# Patient Record
Sex: Female | Born: 1970 | Race: White | Hispanic: No | Marital: Married | Smoking: Never smoker
Health system: Southern US, Community
[De-identification: ages and names within clinical notes are randomized; demographics above are authoritative.]

## PROBLEM LIST (undated history)

## (undated) DIAGNOSIS — T7840XA Allergy, unspecified, initial encounter: Secondary | ICD-10-CM

## (undated) DIAGNOSIS — M1990 Unspecified osteoarthritis, unspecified site: Secondary | ICD-10-CM

## (undated) DIAGNOSIS — K219 Gastro-esophageal reflux disease without esophagitis: Secondary | ICD-10-CM

## (undated) HISTORY — PX: BREAST BIOPSY: SHX20

## (undated) HISTORY — DX: Gastro-esophageal reflux disease without esophagitis: K21.9

## (undated) HISTORY — PX: BREAST EXCISIONAL BIOPSY: SUR124

## (undated) HISTORY — DX: Allergy, unspecified, initial encounter: T78.40XA

## (undated) HISTORY — DX: Unspecified osteoarthritis, unspecified site: M19.90

---

## 2011-10-20 HISTORY — PX: COLONOSCOPY: SHX174

## 2016-01-31 HISTORY — PX: ESOPHAGOGASTRODUODENOSCOPY: SHX1529

## 2021-08-26 ENCOUNTER — Ambulatory Visit (INDEPENDENT_AMBULATORY_CARE_PROVIDER_SITE_OTHER): Payer: No Typology Code available for payment source | Admitting: Family

## 2021-08-26 ENCOUNTER — Other Ambulatory Visit: Payer: Self-pay

## 2021-08-26 ENCOUNTER — Encounter: Payer: Self-pay | Admitting: Family

## 2021-08-26 VITALS — BP 98/58 | HR 75 | Temp 98.1°F | Ht 63.0 in | Wt 148.2 lb

## 2021-08-26 DIAGNOSIS — Z1211 Encounter for screening for malignant neoplasm of colon: Secondary | ICD-10-CM

## 2021-08-26 DIAGNOSIS — M545 Low back pain, unspecified: Secondary | ICD-10-CM | POA: Diagnosis not present

## 2021-08-26 DIAGNOSIS — R350 Frequency of micturition: Secondary | ICD-10-CM | POA: Diagnosis not present

## 2021-08-26 DIAGNOSIS — Z1231 Encounter for screening mammogram for malignant neoplasm of breast: Secondary | ICD-10-CM | POA: Diagnosis not present

## 2021-08-26 DIAGNOSIS — G8929 Other chronic pain: Secondary | ICD-10-CM

## 2021-08-26 LAB — POCT URINALYSIS DIP (MANUAL ENTRY)
Bilirubin, UA: NEGATIVE
Blood, UA: NEGATIVE
Glucose, UA: NEGATIVE mg/dL
Ketones, POC UA: NEGATIVE mg/dL
Nitrite, UA: NEGATIVE
Protein Ur, POC: NEGATIVE mg/dL
Spec Grav, UA: 1.015 (ref 1.010–1.025)
Urobilinogen, UA: 0.2 E.U./dL
pH, UA: 5 (ref 5.0–8.0)

## 2021-08-26 MED ORDER — CYCLOBENZAPRINE HCL 10 MG PO TABS
10.0000 mg | ORAL_TABLET | Freq: Three times a day (TID) | ORAL | 0 refills | Status: DC | PRN
Start: 2021-08-26 — End: 2023-03-21

## 2021-08-26 NOTE — Progress Notes (Signed)
Lindsey Fields is a 50 y.o. female with the following history as recorded in EpicCare:  There are no problems to display for this patient.   Current Outpatient Medications  Medication Sig Dispense Refill   cyclobenzaprine (FLEXERIL) 10 MG tablet Take 1 tablet (10 mg total) by mouth 3 (three) times daily as needed for muscle spasms. 30 tablet 0   fexofenadine (ALLEGRA) 180 MG tablet Take 180 mg by mouth daily.     naproxen sodium (ALEVE) 220 MG tablet Take 220 mg by mouth.     Probiotic Product (PROBIOTIC-10 PO) Take by mouth.     VITAMIN D, CHOLECALCIFEROL, PO Take by mouth.     No current facility-administered medications for this visit.    Allergies: Metaxalone  No past medical history on file.  The histories are not reviewed yet. Please review them in the "History" navigator section and refresh this SmartLink.  No family history on file.  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  Head: Normocephalic and atraumatic  Eyes: Sclera and conjunctiva clear; pupils round and reactive to light; extraocular movements intact  Ears: External normal; canals  clear; tympanic membranes normal  Oropharynx: Pink, supple. No suspicious lesions  Neck: Supple without thyromegaly, adenopathy  Lungs: Respirations unlabored; clear to auscultation bilaterally without wheeze, rales, rhonchi  CVS exam: normal rate and regular rhythm.  Musculoskeletal: No deformities; no active joint inflammation  Extremities: No edema, cyanosis, clubbing  Vessels: Symmetric bilaterally  Neurologic: Alert and oriented; speech intact; face symmetrical; moves all extremities well; CNII-XII intact without focal deficit   Assessment:  1. Chronic left-sided low back pain, unspecified whether sciatica present   2. Visit for screening mammogram   3. Colon cancer screening   4. Urinary frequency     Plan:  Discussed that she will most likely need MRI with possible epidural steroid injection and PT; will refer to sports medicine to discuss management; Order updated; Order for Cologuard; patient is sure that her insurance will cover; Check U/A and urine culture; Kegel exercises discussed; to consider pelvic floor PT as well;   CPE due summer 2023- she will get previous records;   This visit occurred during the SARS-CoV-2 public health emergency.  Safety protocols were in place, including screening questions prior to the visit, additional usage of staff PPE, and extensive cleaning of exam room while observing appropriate contact time as indicated for disinfecting solutions.    No follow-ups on file.  Orders Placed This Encounter  Procedures  Urine Culture   MM Digital Screening    Standing Status:   Future    Standing Expiration Date:   08/26/2022    Order Specific Question:   Reason for Exam (SYMPTOM  OR DIAGNOSIS REQUIRED)    Answer:   screening mammogram    Order Specific Question:   Is the patient pregnant?    Answer:   No    Order Specific Question:   Preferred imaging location?    Answer:   [facility]   Cologuard   Ambulatory referral to Sports Medicine     Referral Priority:   Routine    Referral Type:   Consultation    Number of Visits Requested:   1   POCT urinalysis dipstick    Requested Prescriptions   Signed Prescriptions Disp Refills   cyclobenzaprine (FLEXERIL) 10 MG tablet 30 tablet 0    Sig: Take 1 tablet (10 mg total) by mouth 3 (three) times daily as needed for muscle spasms.

## 2021-08-26 NOTE — Patient Instructions (Addendum)
  Very nice to meet you today; please get your records from your GYN as we discussed;    Kegel Exercises Kegel exercises can help strengthen your pelvic floor muscles. The pelvic floor is a group of muscles that support your rectum, small intestine, and bladder. In females, pelvic floor muscles also help support the womb (uterus). These muscles help you control the flow of urine and stool. Kegel exercises are painless and simple, and they do not require any equipment. Your provider may suggest Kegel exercises to: Improve bladder and bowel control. Improve sexual response. Improve weak pelvic floor muscles after surgery to remove the uterus (hysterectomy) or pregnancy (females). Improve weak pelvic floor muscles after prostate gland removal or surgery (males). Kegel exercises involve squeezing your pelvic floor muscles, which are the same muscles you squeeze when you try to stop the flow of urine or keep from passing gas. The exercises can be done while sitting, standing, or lying down, but it is best to vary your position. Exercises How to do Kegel exercises: Squeeze your pelvic floor muscles tight. You should feel a tight lift in your rectal area. If you are a female, you should also feel a tightness in your vaginal area. Keep your stomach, buttocks, and legs relaxed. Hold the muscles tight for up to 10 seconds. Breathe normally. Relax your muscles. Repeat as told by your health care provider. Repeat this exercise daily as told by your health care provider. Continue to do this exercise for at least 4-6 weeks, or for as long as told by your health care provider. You may be referred to a physical therapist who can help you learn more about how to do Kegel exercises. Depending on your condition, your health care provider may recommend: Varying how long you squeeze your muscles. Doing several sets of exercises every day. Doing exercises for several weeks. Making Kegel exercises a part of your  regular exercise routine. This information is not intended to replace advice given to you by your health care provider. Make sure you discuss any questions you have with your health care provider. Document Revised: 10/13/2020 Document Reviewed: 06/12/2018 Elsevier Patient Education  2022 [employer].

## 2021-08-27 LAB — URINE CULTURE
MICRO NUMBER:: 12535051
Result:: NO GROWTH
SPECIMEN QUALITY:: ADEQUATE

## 2021-09-08 ENCOUNTER — Telehealth (HOSPITAL_BASED_OUTPATIENT_CLINIC_OR_DEPARTMENT_OTHER): Payer: Self-pay

## 2021-09-21 ENCOUNTER — Ambulatory Visit (INDEPENDENT_AMBULATORY_CARE_PROVIDER_SITE_OTHER): Payer: No Typology Code available for payment source | Admitting: Family Medicine

## 2021-09-21 ENCOUNTER — Encounter: Payer: Self-pay | Admitting: Family Medicine

## 2021-09-21 VITALS — Ht 63.0 in | Wt 148.0 lb

## 2021-09-21 DIAGNOSIS — M357 Hypermobility syndrome: Secondary | ICD-10-CM | POA: Diagnosis not present

## 2021-09-21 DIAGNOSIS — M533 Sacrococcygeal disorders, not elsewhere classified: Secondary | ICD-10-CM | POA: Insufficient documentation

## 2021-09-21 MED ORDER — DICLOFENAC SODIUM 2 % EX SOLN: 1.0000 "application " | Freq: Two times a day (BID) | CUTANEOUS | 2 refills | Status: DC

## 2021-09-21 NOTE — Progress Notes (Signed)
Tried Noha Milberger - 50 y.o. female MRN [id number]  Date of birth: 03-29-1971  SUBJECTIVE:  Including CC & ROS.  No chief complaint on file.   Jearline Hirschhorn is a 50 y.o. female that is presenting with left-sided gluteal pain.  Pain is intermittent in nature.  Has tried physical therapy in the past.  No injury inciting event.   Review of Systems See HPI   HISTORY: Past Medical, Surgical, Social, and Family History Reviewed & Updated per EMR.   Pertinent Historical Findings include:  History reviewed. No pertinent past medical history.  History reviewed. No pertinent surgical history.  History reviewed. No pertinent family history.  Social History   Socioeconomic History   Marital status: Married    Spouse name: Not on file   Number of children: Not on file   Years of education: Not on file   Highest education level: Not on file  Occupational History   Not on file  Tobacco Use   Smoking status: Never   Smokeless tobacco: Never  Substance and Sexual Activity   Alcohol use: Not on file   Drug use: Not on file   Sexual activity: Not on file  Other Topics Concern   Not on file  Social History Narrative   Not on file   Social Determinants of Health   Financial Resource Strain: Not on file  Food Insecurity: Not on file  Transportation Needs: Not on file  Physical Activity: Not on file  Stress: Not on file  Social Connections: Not on file  Intimate Partner Violence: Not on file     PHYSICAL EXAM:  VS: Ht 5\' 3"  (1.6 m)   Wt 148 lb (67.1 kg)   LMP  (LMP Unknown)   BMI 26.22 kg/m  Physical Exam Gen: NAD, alert, cooperative with exam, well-appearing     ASSESSMENT & PLAN:   Hypermobility syndrome Has a component of hypermobility is likely related to the chronicity of her symptoms with normal imaging that has been done previously. -Could consider body braid.  Sacroiliac joint dysfunction of left side Having more pain over the SI joint or the referred pain to  the gluteus. -Counseled on home exercise therapy and supportive care. -Pennsaid. -Could consider physical therapy, imaging or injection.

## 2021-09-21 NOTE — Patient Instructions (Signed)
Nice to meet you  Please try heat over the area  You can try the rub on medicine  Please consider the compression  Please try the exercises  Please send me a message in MyChart with any questions or updates.  Please see me back in 4 weeks.   --Dr. Jordan Likes

## 2021-09-21 NOTE — Assessment & Plan Note (Signed)
Having more pain over the SI joint or the referred pain to the gluteus. -Counseled on home exercise therapy and supportive care. -Pennsaid. -Could consider physical therapy, imaging or injection.

## 2021-09-21 NOTE — Assessment & Plan Note (Signed)
Has a component of hypermobility is likely related to the chronicity of her symptoms with normal imaging that has been done previously. -Could consider body braid.

## 2021-10-25 ENCOUNTER — Encounter (HOSPITAL_BASED_OUTPATIENT_CLINIC_OR_DEPARTMENT_OTHER): Payer: Self-pay

## 2021-10-25 ENCOUNTER — Other Ambulatory Visit: Payer: Self-pay

## 2021-10-25 ENCOUNTER — Ambulatory Visit (HOSPITAL_BASED_OUTPATIENT_CLINIC_OR_DEPARTMENT_OTHER)
Admission: RE | Admit: 2021-10-25 | Discharge: 2021-10-25 | Disposition: A | Discharge status: Home / Self Care | Payer: No Typology Code available for payment source | Source: Ambulatory Visit | Attending: Family | Admitting: Family

## 2021-10-25 DIAGNOSIS — Z1231 Encounter for screening mammogram for malignant neoplasm of breast: Secondary | ICD-10-CM

## 2021-10-27 ENCOUNTER — Encounter: Payer: Self-pay | Admitting: Family Medicine

## 2021-10-27 ENCOUNTER — Ambulatory Visit (INDEPENDENT_AMBULATORY_CARE_PROVIDER_SITE_OTHER): Payer: No Typology Code available for payment source | Admitting: Family Medicine

## 2021-10-27 DIAGNOSIS — M533 Sacrococcygeal disorders, not elsewhere classified: Secondary | ICD-10-CM | POA: Diagnosis not present

## 2021-10-27 NOTE — Assessment & Plan Note (Signed)
Pain is still ongoing.  Has gotten improvement with the compression. -Counseled on home exercise therapy and supportive care. -could consider physical therapy or injection.

## 2021-10-27 NOTE — Progress Notes (Signed)
°  Lindsey Fields - 50 y.o. female MRN [id number]  Date of birth: 19-Sep-1971  SUBJECTIVE:  Including CC & ROS.  No chief complaint on file.   Lindsey Fields is a 50 y.o. female that is following up for her left gluteal pain.  She has gotten a compression of the does add a benefit to wearing it.  Pain is still occurs and has trouble putting on her shoes.   Review of Systems See HPI   HISTORY: Past Medical, Surgical, Social, and Family History Reviewed & Updated per EMR.   Pertinent Historical Findings include:  History reviewed. No pertinent past medical history.  Past Surgical History:  Procedure Laterality Date   BREAST BIOPSY Left    BREAST EXCISIONAL BIOPSY Left     History reviewed. No pertinent family history.  Social History   Socioeconomic History   Marital status: Married    Spouse name: Not on file   Number of children: Not on file   Years of education: Not on file   Highest education level: Not on file  Occupational History   Not on file  Tobacco Use   Smoking status: Never   Smokeless tobacco: Never  Substance and Sexual Activity   Alcohol use: Not on file   Drug use: Not on file   Sexual activity: Not on file  Other Topics Concern   Not on file  Social History Narrative   Not on file   Social Determinants of Health   Financial Resource Strain: Not on file  Food Insecurity: Not on file  Transportation Needs: Not on file  Physical Activity: Not on file  Stress: Not on file  Social Connections: Not on file  Intimate Partner Violence: Not on file     PHYSICAL EXAM:  VS: BP 108/70 (BP Location: Left Arm, Patient Position: Sitting)    Ht 5\' 3"  (1.6 m)    Wt 148 lb (67.1 kg)    BMI 26.22 kg/m  Physical Exam Gen: NAD, alert, cooperative with exam, well-appearing   ASSESSMENT & PLAN:   Sacroiliac joint dysfunction of left side Pain is still ongoing.  Has gotten improvement with the compression. -Counseled on home exercise therapy and supportive  care. -could consider physical therapy or injection.

## 2022-01-10 ENCOUNTER — Encounter: Payer: Self-pay | Admitting: Family

## 2022-01-10 ENCOUNTER — Ambulatory Visit (INDEPENDENT_AMBULATORY_CARE_PROVIDER_SITE_OTHER): Payer: No Typology Code available for payment source | Admitting: Family

## 2022-01-10 VITALS — BP 110/70 | HR 65 | Temp 97.8°F | Ht 63.0 in | Wt 145.8 lb

## 2022-01-10 DIAGNOSIS — R1012 Left upper quadrant pain: Secondary | ICD-10-CM

## 2022-01-10 DIAGNOSIS — R109 Unspecified abdominal pain: Secondary | ICD-10-CM | POA: Diagnosis not present

## 2022-01-10 NOTE — Progress Notes (Signed)
?Lindsey Fields is a 51 y.o. female with the following history as recorded in EpicCare:  ?Patient Active Problem List  ? Diagnosis Date Noted  ? Sacroiliac joint dysfunction of left side 09/21/2021  ? Hypermobility syndrome 09/21/2021  ?  ?Current Outpatient Medications  ?Medication Sig Dispense Refill  ? cyclobenzaprine (FLEXERIL) 10 MG tablet Take 1 tablet (10 mg total) by mouth 3 (three) times daily as needed for muscle spasms. 30 tablet 0  ? fexofenadine (ALLEGRA) 180 MG tablet Take 180 mg by mouth daily.    ? naproxen sodium (ALEVE) 220 MG tablet Take 220 mg by mouth.    ? Probiotic Product (PROBIOTIC-10 PO) Take by mouth.    ? VITAMIN D, CHOLECALCIFEROL, PO Take by mouth.    ? ?No current facility-administered medications for this visit.  ?  ?Allergies: Metaxalone  ?No past medical history on file.  ?Past Surgical History:  ?Procedure Laterality Date  ? BREAST BIOPSY Left   ? BREAST EXCISIONAL BIOPSY Left   ?  ?No family history on file.  ?Social History  ? ?Tobacco Use  ? Smoking status: Never  ? Smokeless tobacco: Never  ?Substance Use Topics  ? Alcohol use: Not on file  ?  ?Subjective:  ? ?Abdominal pain/ bloating x 3-4 weeks; limited response to OTC medication; ?Feels like pain has worsened in the past 2-3 days; pain is localized in the LUQ;  ?Normal bowel movements; no nausea or vomiting;  ?FH of gallbladder disease/ chronic pancreatitis;  ? ? ? ?Objective:  ?Vitals:  ? 01/10/22 1552  ?BP: 110/70  ?Pulse: 65  ?Temp: 97.8 ?F (36.6 ?C)  ?SpO2: 96%  ?Weight: 145 lb 12.8 oz (66.1 kg)  ?Height: '5\' 3"'  (1.6 m)  ?  ?General: Well developed, well nourished, in no acute distress  ?Skin : Warm and dry.  ?Head: Normocephalic and atraumatic  ?Eyes: Sclera and conjunctiva clear; pupils round and reactive to light; extraocular movements intact  ?Ears: External normal; canals clear; tympanic membranes normal  ?Oropharynx: Pink, supple. No suspicious lesions  ?Neck: Supple without thyromegaly, adenopathy  ?Lungs:  Respirations unlabored; clear to auscultation bilaterally without wheeze, rales, rhonchi  ?CVS exam: normal rate and regular rhythm.  ?Abdomen: Soft; nontender; nondistended; normoactive bowel sounds; no masses or hepatosplenomegaly  ?Neurologic: Alert and oriented; speech intact; face symmetrical; moves all extremities well; CNII-XII intact without focal deficit  ? ?Assessment:  ?1. Abdominal pain, unspecified abdominal location   ?2. LUQ pain   ?  ?Plan:  ?? Gallbladder disease; update labs today and update abdominal imaging; follow up to be determined; discussed medication but agree that most likely not beneficial since no relief with OTC medications;  ? ?This visit occurred during the SARS-CoV-2 public health emergency.  Safety protocols were in place, including screening questions prior to the visit, additional usage of staff PPE, and extensive cleaning of exam room while observing appropriate contact time as indicated for disinfecting solutions.  ? ? ?No follow-ups on file.  ?Orders Placed This Encounter  ?Procedures  ? US Abdomen Complete  ?  Standing Status:   Future  ?  Standing Expiration Date:   01/11/2023  ?  Order Specific Question:   Reason for Exam (SYMPTOM  OR DIAGNOSIS REQUIRED)  ?  Answer:   abdominal pain  ?  Order Specific Question:   Preferred imaging location?  ?  Answer:   [facility]  ? CBC with Differential/Platelet  ? Comp Met (CMET)  ? Amylase  ? Lipase  ?  ?  Requested Prescriptions  ? ? No prescriptions requested or ordered in this encounter  ?  ? ?

## 2022-01-10 NOTE — Progress Notes (Signed)
dig 

## 2022-01-11 LAB — CBC WITH DIFFERENTIAL/PLATELET
Basophils Absolute: 0.1 10*3/uL (ref 0.0–0.1)
Basophils Relative: 1.2 % (ref 0.0–3.0)
Eosinophils Absolute: 0.1 10*3/uL (ref 0.0–0.7)
Eosinophils Relative: 1.2 % (ref 0.0–5.0)
HCT: 38.5 % (ref 36.0–46.0)
Hemoglobin: 13 g/dL (ref 12.0–15.0)
Lymphocytes Relative: 28.9 % (ref 12.0–46.0)
Lymphs Abs: 1.9 10*3/uL (ref 0.7–4.0)
MCHC: 33.7 g/dL (ref 30.0–36.0)
MCV: 92.9 fl (ref 78.0–100.0)
Monocytes Absolute: 0.4 10*3/uL (ref 0.1–1.0)
Monocytes Relative: 5.3 % (ref 3.0–12.0)
Neutro Abs: 4.2 10*3/uL (ref 1.4–7.7)
Neutrophils Relative %: 63.4 % (ref 43.0–77.0)
Platelets: 289 10*3/uL (ref 150.0–400.0)
RBC: 4.14 Mil/uL (ref 3.87–5.11)
RDW: 13 % (ref 11.5–15.5)
WBC: 6.7 10*3/uL (ref 4.0–10.5)

## 2022-01-11 LAB — COMPREHENSIVE METABOLIC PANEL
ALT: 24 U/L (ref 0–35)
AST: 26 U/L (ref 0–37)
Albumin: 4.3 g/dL (ref 3.5–5.2)
Alkaline Phosphatase: 85 U/L (ref 39–117)
BUN: 13 mg/dL (ref 6–23)
CO2: 27 mEq/L (ref 19–32)
Calcium: 9 mg/dL (ref 8.4–10.5)
Chloride: 106 mEq/L (ref 96–112)
Creatinine, Ser: 0.77 mg/dL (ref 0.40–1.20)
GFR: 89.53 mL/min (ref >60.00)
Glucose, Bld: 82 mg/dL (ref 70–99)
Potassium: 4.2 mEq/L (ref 3.5–5.1)
Sodium: 141 mEq/L (ref 135–145)
Total Bilirubin: 0.4 mg/dL (ref 0.2–1.2)
Total Protein: 6.8 g/dL (ref 6.0–8.3)

## 2022-01-11 LAB — LIPASE: Lipase: 15 U/L (ref 11.0–59.0)

## 2022-01-11 LAB — AMYLASE: Amylase: 79 U/L (ref 27–131)

## 2022-01-16 ENCOUNTER — Ambulatory Visit (HOSPITAL_BASED_OUTPATIENT_CLINIC_OR_DEPARTMENT_OTHER)
Admission: RE | Admit: 2022-01-16 | Discharge: 2022-01-16 | Disposition: A | Discharge status: Home / Self Care | Payer: No Typology Code available for payment source | Source: Ambulatory Visit | Attending: Family | Admitting: Family

## 2022-01-16 ENCOUNTER — Other Ambulatory Visit: Payer: Self-pay

## 2022-01-16 ENCOUNTER — Other Ambulatory Visit: Payer: Self-pay | Admitting: Family

## 2022-01-16 DIAGNOSIS — R109 Unspecified abdominal pain: Secondary | ICD-10-CM

## 2022-01-25 ENCOUNTER — Ambulatory Visit (INDEPENDENT_AMBULATORY_CARE_PROVIDER_SITE_OTHER): Payer: No Typology Code available for payment source | Admitting: Gastroenterology

## 2022-01-25 ENCOUNTER — Encounter: Payer: Self-pay | Admitting: Gastroenterology

## 2022-01-25 ENCOUNTER — Other Ambulatory Visit: Payer: Self-pay

## 2022-01-25 VITALS — BP 102/68 | HR 74 | Ht 62.0 in | Wt 149.0 lb

## 2022-01-25 DIAGNOSIS — K219 Gastro-esophageal reflux disease without esophagitis: Secondary | ICD-10-CM

## 2022-01-25 DIAGNOSIS — K30 Functional dyspepsia: Secondary | ICD-10-CM | POA: Diagnosis not present

## 2022-01-25 DIAGNOSIS — Z1211 Encounter for screening for malignant neoplasm of colon: Secondary | ICD-10-CM | POA: Diagnosis not present

## 2022-01-25 DIAGNOSIS — R142 Eructation: Secondary | ICD-10-CM

## 2022-01-25 NOTE — Progress Notes (Signed)
? ? ?          ?Chief Complaint: Abdominal pain, bloating, GERD ? ? ?Referring Provider:     Marrian Salvage, FNP ? ? ?HPI:   ? ? ?Lindsey Fields is a 51 y.o. female referred to the Gastroenterology Clinic for evaluation of GERD, abdominal pain and bloating. ? ?Symptoms started a few months ago as bloating, indigestion, belching, and separately with left-sided pain.  No associated changes in bowel habits, nausea, vomiting, fever. Tried taking her omperazole, but no improvement in left-sided pain. ? ?Was seen by her PCM for this issue on 01/10/2022. ?- Normal CBC, CMP, amylase, lipase ?- 01/16/2022: Abdominal ultrasound: Normal ? ?She does have a hx of GERD, which she controls OTC omeprazole 20 mg prn. Index sxs of belching, regurgitation, indigestion, similar to the above symptoms. Regurgitation with forward flexion. No HB. No dysphagia.  ? ?Per patient, normal EGD in [place] in 2017. Normal colonoscopy in [place] in 2012. ? ? ?No past medical history on file. ? ? ?Past Surgical History:  ?Procedure Laterality Date  ? BREAST BIOPSY Left   ? BREAST EXCISIONAL BIOPSY Left   ? COLONOSCOPY  10/20/2011  ? ?Family History  ?Problem Relation Age of Onset  ? Heart disease Mother   ? Pancreatitis Father   ? ?Social History  ? ?Tobacco Use  ? Smoking status: Never  ? Smokeless tobacco: Never  ?Vaping Use  ? Vaping Use: Never used  ?Substance Use Topics  ? Alcohol use: Yes  ?  Comment: 2 per week  ? Drug use: Not Currently  ? ?Current Outpatient Medications  ?Medication Sig Dispense Refill  ? cyclobenzaprine (FLEXERIL) 10 MG tablet Take 1 tablet (10 mg total) by mouth 3 (three) times daily as needed for muscle spasms. 30 tablet 0  ? fexofenadine (ALLEGRA) 180 MG tablet Take 180 mg by mouth daily.    ? naproxen sodium (ALEVE) 220 MG tablet Take 220 mg by mouth.    ? Probiotic Product (PROBIOTIC-10 PO) Take by mouth.    ? VITAMIN D, CHOLECALCIFEROL, PO Take by mouth.    ? ?No current facility-administered medications for  this visit.  ? ?Allergies  ?Allergen Reactions  ? Metaxalone Other (See Comments)  ?  Altered taste in mouth  ? ? ? ?Review of Systems: ?All systems reviewed and negative except where noted in HPI.  ? ? ? ?Physical Exam:   ? ?Wt Readings from Last 3 Encounters:  ?01/25/22 149 lb (67.6 kg)  ?01/10/22 145 lb 12.8 oz (66.1 kg)  ?10/27/21 148 lb (67.1 kg)  ? ? ?Pulse 74   Ht '5\' 2"'  (1.575 m)   Wt 149 lb (67.6 kg)   LMP  (LMP Unknown)   SpO2 95%   BMI 27.25 kg/m?  ?Constitutional:  Pleasant, in no acute distress. ?Psychiatric: Normal mood and affect. Behavior is normal. ?Cardiovascular: Normal rate, regular rhythm. No edema ?Pulmonary/chest: Effort normal and breath sounds normal. No wheezing, rales or rhonchi. ?Abdominal: Soft, nondistended, nontender. Bowel sounds active throughout. There are no masses palpable. No hepatomegaly. ?Neurological: Alert and oriented to person place and time. ?Skin: Skin is warm and dry. No rashes noted. ? ? ?ASSESSMENT AND PLAN;  ? ?1) GERD ?2) Belching ?3) Regurgitation ?4) Indigestion ?Reports a longstanding history of GERD for the last 19 years or so.  Uses omeprazole prn.  Suspect she also has a hiatal hernia based on the degree of regurgitation reported.  Discussed pathophysiology of reflux at length today with plan  for the following: ?- EGD to evaluate for erosive esophagitis along with degree of LES laxity and hiatal hernia ?- Okay to continue omeprazole as currently doing ?- Continue antireflux lifestyle/dietary modifications to include HOB elevation, and avoidance of eating within 3 hours of bedtime ? ?5) Left-sided pain ?- Exam and clinical history c/w MSK pain, but can certainly evaluate for mucosal/luminal pathology at time of EGD as above ? ?6) Colon cancer screening ?- Due for age-appropriate, average risk CRC screening ?- Does not want to repeat colonoscopy at this time ?- Has Cologuard kit at home and will submit ? ?The indications, risks, and benefits of EGD were  explained to the patient in detail. Risks include but are not limited to bleeding, perforation, adverse reaction to medications, and cardiopulmonary compromise. Sequelae include but are not limited to the possibility of surgery, hospitalization, and mortality. The patient verbalized understanding and wished to proceed. All questions answered, referred to scheduler. Further recommendations pending results of the exam.  ? ? ? ?Lavena Bullion, DO, FACG  01/25/2022, 3:56 PM ? ? ?Marrian Salvage,* ? ?

## 2022-01-25 NOTE — Patient Instructions (Addendum)
If you are age 51 or older, your body mass index should be between 23-30. Your Body mass index is 27.25 kg/m?Marland Kitchen If this is out of the aforementioned range listed, please consider follow up with your Primary Care Provider. ? ?If you are age 51 or younger, your body mass index should be between 19-25. Your Body mass index is 27.25 kg/m?Marland Kitchen If this is out of the aformentioned range listed, please consider follow up with your Primary Care Provider.  ? ?________________________________________________________ ? ?The [facility] providers would like to encourage you to use [facility] to communicate with providers for non-urgent requests or questions.  Due to long hold times on the telephone, sending your provider a message by [facility] may be a faster and more efficient way to get a response.  Please allow 48 business hours for a response.  Please remember that this is for non-urgent requests.  ?_______________________________________________________ ? ? ?You have been scheduled for an endoscopy. Please follow written instructions given to you at your visit today. ?If you use inhalers (even only as needed), please bring them with you on the day of your procedure. ? ?It was a pleasure to see you today! ? ?Gerrit Heck, D.O ? ? ?We want to thank you for trusting [facility] with your care. All of our staff and providers value the relationships we have built with our patients, and it is an honor to care for you.  ? ?We are writing to let you know that [facility] will close on Mar 20, 2022, and we invite you to continue to see Dr. Carmell Austria and Gerrit Heck at the [facility] [place] office location. We are consolidating our serices at these [facility] practices to better provide care. Our office staff will work with you to ensure a seamless transition.  ? ?Gerrit Heck, DO -Dr. Bryan Lemma will be movig to [facility] at [address] [place],  [place] [address], effective Mar 20, 2022.  Contact [phone] to schedule an appointment with him.  ? ?Carmell Austria, MD- Dr. Lyndel Safe will be movig to [facility] at [address], [place], [place] [address], effective Mar 20, 2022.  Contact [phone] to schedule an appointment with him.  ? ?Requesting Medical Records ?If you need to request your medical records, please follow the instructions below. Your medical records are confidential, and a copy can be transferred to another provider or released to you or another person you designate only with your permission. ? ?There are several ways to request your medical records: ?Requests for medical records can be submitted through our practice.   ?You can also request your records electronically, in your MyChart account by selecting the ?Request Health Records? tab.  ?If you need additional information on how to request records, please go to [email or web address]/, choose Patient Information, then select Request Medical Records. ?To make an appointment or if you have any questions about your health care needs, please contact our office at [phone] and one of our staff members will be glad to assist you. ?[facility] is committed to providing exceptional care for you and our community. Thank you for allowing [place] to serve your health care needs. ?Sincerely, ? ?Windy Canny, Director [facility] ?[facility] also offers convenient virtual care options. Sore throat? Sinus problems? Cold or flu symptoms? Get care from the comfort of home with [facility] Video Visits and e-Visits. Learn more about the non-emergency conditions treated and start your virtual visit at [email or web address] ? ?

## 2022-02-17 ENCOUNTER — Encounter: Payer: Self-pay | Admitting: Gastroenterology

## 2022-02-17 ENCOUNTER — Ambulatory Visit (AMBULATORY_SURGERY_CENTER): Payer: No Typology Code available for payment source | Admitting: Gastroenterology

## 2022-02-17 VITALS — BP 102/53 | HR 60 | Temp 97.5°F | Resp 14 | Ht 62.0 in | Wt 149.0 lb

## 2022-02-17 DIAGNOSIS — R12 Heartburn: Secondary | ICD-10-CM

## 2022-02-17 DIAGNOSIS — K30 Functional dyspepsia: Secondary | ICD-10-CM

## 2022-02-17 DIAGNOSIS — K219 Gastro-esophageal reflux disease without esophagitis: Secondary | ICD-10-CM

## 2022-02-17 DIAGNOSIS — R142 Eructation: Secondary | ICD-10-CM | POA: Diagnosis not present

## 2022-02-17 DIAGNOSIS — R140 Abdominal distension (gaseous): Secondary | ICD-10-CM

## 2022-02-17 DIAGNOSIS — K319 Disease of stomach and duodenum, unspecified: Secondary | ICD-10-CM

## 2022-02-17 MED ORDER — SODIUM CHLORIDE 0.9 % IV SOLN: 500.0000 mL | INTRAVENOUS | Status: DC

## 2022-02-17 NOTE — Progress Notes (Signed)
? ?GASTROENTEROLOGY PROCEDURE H&P NOTE  ? ?Primary Care Physician: ?Olive Bass, FNP ? ? ? ?Reason for Procedure:   Abdominal bloating, GERD, belching, left sided abdominal pain ? ?Plan:    EGD ? ?Patient is appropriate for endoscopic procedure(s) in the ambulatory ([facility]) setting. ? ?The nature of the procedure, as well as the risks, benefits, and alternatives were carefully and thoroughly reviewed with the patient. Ample time for discussion and questions allowed. The patient understood, was satisfied, and agreed to proceed.  ? ? ? ?HPI: ?Lindsey Fields is a 51 y.o. female who presents for EGD for evaluation of GERD, regurgitation, belching, abdominal bloating and abdominal pain.  Patient was most recently seen in the Gastroenterology Clinic on 01/25/2022 by me.  No interval change in medical history since that appointment. Please refer to that note for full details regarding GI history and clinical presentation.  ? ?Past Medical History:  ?Diagnosis Date  ? Allergy   ? Arthritis   ? GERD (gastroesophageal reflux disease)   ? ? ?Past Surgical History:  ?Procedure Laterality Date  ? BREAST BIOPSY Left   ? BREAST EXCISIONAL BIOPSY Left   ? COLONOSCOPY  10/20/2011  ? ? ?Prior to Admission medications   ?Medication Sig Start Date End Date Taking? Authorizing Provider  ?fexofenadine (ALLEGRA) 180 MG tablet Take 180 mg by mouth daily.   Yes [provider]  ?naproxen sodium (ALEVE) 220 MG tablet Take 220 mg by mouth.   Yes [provider]  ?Probiotic Product (PROBIOTIC-10 PO) Take by mouth.   Yes [provider]  ?VITAMIN D, CHOLECALCIFEROL, PO Take by mouth.   Yes [provider]  ?cyclobenzaprine (FLEXERIL) 10 MG tablet Take 1 tablet (10 mg total) by mouth 3 (three) times daily as needed for muscle spasms. 08/26/21   Olive Bass, FNP  ? ? ?Current Outpatient Medications  ?Medication Sig Dispense Refill  ? fexofenadine (ALLEGRA) 180 MG tablet Take 180 mg by mouth  daily.    ? naproxen sodium (ALEVE) 220 MG tablet Take 220 mg by mouth.    ? Probiotic Product (PROBIOTIC-10 PO) Take by mouth.    ? VITAMIN D, CHOLECALCIFEROL, PO Take by mouth.    ? cyclobenzaprine (FLEXERIL) 10 MG tablet Take 1 tablet (10 mg total) by mouth 3 (three) times daily as needed for muscle spasms. 30 tablet 0  ? ?Current Facility-Administered Medications  ?Medication Dose Route Frequency Provider Last Rate Last Admin  ? 0.9 %  sodium chloride infusion  500 mL Intravenous Continuous Zvi Duplantis V, DO      ? ? ?Allergies as of 02/17/2022 - Review Complete 02/17/2022  ?Allergen Reaction Noted  ? Metaxalone Other (See Comments) 06/04/2021  ? ? ?Family History  ?Problem Relation Age of Onset  ? Heart disease Mother   ? Colon polyps Father   ? Pancreatitis Father   ? Colon cancer Neg Hx   ? Esophageal cancer Neg Hx   ? Rectal cancer Neg Hx   ? Stomach cancer Neg Hx   ? ? ?Social History  ? ?Socioeconomic History  ? Marital status: Married  ?  Spouse name: Minerva Areola  ? Number of children: 2  ? Years of education: Not on file  ? Highest education level: Not on file  ?Occupational History  ? Not on file  ?Tobacco Use  ? Smoking status: Never  ? Smokeless tobacco: Never  ?Vaping Use  ? Vaping Use: Never used  ?Substance and Sexual Activity  ? Alcohol use:  Yes  ?  Comment: 2 per week  ? Drug use: Not Currently  ? Sexual activity: Not on file  ?Other Topics Concern  ? Not on file  ?Social History Narrative  ? Not on file  ? ?Social Determinants of Health  ? ?Financial Resource Strain: Not on file  ?Food Insecurity: Not on file  ?Transportation Needs: Not on file  ?Physical Activity: Not on file  ?Stress: Not on file  ?Social Connections: Not on file  ?Intimate Partner Violence: Not on file  ? ? ?Physical Exam: ?Vital signs in last 24 hours: ?@BP  115/73   Pulse 61   Temp (!) 97.5 ?F (36.4 ?C) (Temporal)   Ht 5\' 2"  (1.575 m)   Wt 149 lb (67.6 kg)   SpO2 98%   BMI 27.25 kg/m?  ?GEN: NAD ?EYE: Sclerae  anicteric ?ENT: MMM ?CV: Non-tachycardic ?Pulm: CTA b/l ?GI: Soft, NT/ND ?NEURO:  Alert & Oriented x 3 ? ? ? , DO ?[facility] ? ? ?02/17/2022 9:56 AM ? ?

## 2022-02-17 NOTE — Progress Notes (Signed)
Pt's states no medical or surgical changes since previsit or office visit. 

## 2022-02-17 NOTE — Progress Notes (Signed)
Called to room to assist during endoscopic procedure.  Patient ID and intended procedure confirmed with present staff. Received instructions for my participation in the procedure from the performing physician.  

## 2022-02-17 NOTE — Progress Notes (Signed)
To Pacu, VSS. Report to [email or web address].tb 

## 2022-02-17 NOTE — Patient Instructions (Signed)
YOU HAD AN ENDOSCOPIC PROCEDURE TODAY AT THE [facility]:   Refer to the procedure report that was given to you for any specific questions about what was found during the examination.  If the procedure report does not answer your questions, please call your gastroenterologist to clarify.  If you requested that your care partner not be given the details of your procedure findings, then the procedure report has been included in a sealed envelope for you to review at your convenience later.  YOU SHOULD EXPECT: Some feelings of bloating in the abdomen. Passage of more gas than usual.  Walking can help get rid of the air that was put into your GI tract during the procedure and reduce the bloating. If you had a lower endoscopy (such as a colonoscopy or flexible sigmoidoscopy) you may notice spotting of blood in your stool or on the toilet paper. If you underwent a bowel prep for your procedure, you may not have a normal bowel movement for a few days.  Please Note:  You might notice some irritation and congestion in your nose or some drainage.  This is from the oxygen used during your procedure.  There is no need for concern and it should clear up in a day or so.  SYMPTOMS TO REPORT IMMEDIATELY:    Following upper endoscopy (EGD)  Vomiting of blood or coffee ground material  New chest pain or pain under the shoulder blades  Painful or persistently difficult swallowing  New shortness of breath  Fever of 100F or higher  Black, tarry-looking stools  For urgent or emergent issues, a gastroenterologist can be reached at any hour by calling [phone]. Do not use MyChart messaging for urgent concerns.    DIET:  We do recommend a small meal at first, but then you may proceed to your regular diet.  Drink plenty of fluids but you should avoid alcoholic beverages for 24 hours.  ACTIVITY:  You should plan to take it easy for the rest of today and you should NOT DRIVE or use heavy machinery  until tomorrow (because of the sedation medicines used during the test).    FOLLOW UP: Our staff will call the number listed on your records 48-72 hours following your procedure to check on you and address any questions or concerns that you may have regarding the information given to you following your procedure. If we do not reach you, we will leave a message.  We will attempt to reach you two times.  During this call, we will ask if you have developed any symptoms of COVID 19. If you develop any symptoms (ie: fever, flu-like symptoms, shortness of breath, cough etc.) before then, please call [phone].  If you test positive for Covid 19 in the 2 weeks post procedure, please call and report this information to us.    If any biopsies were taken you will be contacted by phone or by letter within the next 1-3 weeks.  Please call us at [phone] if you have not heard about the biopsies in 3 weeks.    SIGNATURES/CONFIDENTIALITY: You and/or your care partner have signed paperwork which will be entered into your electronic medical record.  These signatures attest to the fact that that the information above on your After Visit Summary has been reviewed and is understood.  Full responsibility of the confidentiality of this discharge information lies with you and/or your care-partner. 

## 2022-02-17 NOTE — Op Note (Signed)
[facility] ?Patient Name: Lindsey Fields ?Procedure Date: 02/17/2022 10:01 AM ?MRN: [id number] ?Endoscopist: Doristine Locks , MD ?Age: 51 ?Referring MD:  ?Date of Birth: 06-26-1971 ?Gender: Female ?Account #: [id number] ?Procedure:                Upper GI endoscopy ?Indications:              Suspected esophageal reflux, Abdominal bloating,  ?                          Eructation, Regurgitation, Left sided abdominal pain ?Medicines:                Monitored Anesthesia Care ?Procedure:                Pre-Anesthesia Assessment: ?                          - Prior to the procedure, a History and Physical  ?                          was performed, and patient medications and  ?                          allergies were reviewed. The patient's tolerance of  ?                          previous anesthesia was also reviewed. The risks  ?                          and benefits of the procedure and the sedation  ?                          options and risks were discussed with the patient.  ?                          All questions were answered, and informed consent  ?                          was obtained. Prior Anticoagulants: The patient has  ?                          taken no previous anticoagulant or antiplatelet  ?                          agents. ASA Grade Assessment: II - A patient with  ?                          mild systemic disease. After reviewing the risks  ?                          and benefits, the patient was deemed in  ?                          satisfactory condition to undergo the procedure. ?  After obtaining informed consent, the endoscope was  ?                          passed under direct vision. Throughout the  ?                          procedure, the patient's blood pressure, pulse, and  ?                          oxygen saturations were monitored continuously. The  ?                          GIF [id number] #[id number] was introduced through the  ?                          mouth,  and advanced to the second part of duodenum.  ?                          The upper GI endoscopy was accomplished without  ?                          difficulty. The patient tolerated the procedure  ?                          well. ?Scope In: ?Scope Out: ?Findings:                 The examined esophagus was normal. ?                          The Z-line was regular and was found 36 cm from the  ?                          incisors. ?                          The gastroesophageal flap valve was visualized  ?                          endoscopically and classified as Hill Grade II  ?                          (fold present, opens with respiration). ?                          The entire examined stomach was normal. Biopsies  ?                          were taken with a cold forceps for Helicobacter  ?                          pylori testing. Estimated blood loss was minimal. ?                          The examined duodenum was normal. Biopsies were  ?  taken with a cold forceps for histology. Estimated  ?                          blood loss was minimal. ?Complications:            No immediate complications. ?Estimated Blood Loss:     Estimated blood loss was minimal. ?Impression:               - Normal esophagus. ?                          - Z-line regular, 36 cm from the incisors. ?                          - Gastroesophageal flap valve classified as Hill  ?                          Grade II (fold present, opens with respiration). ?                          - Normal stomach. Biopsied. ?                          - Normal examined duodenum. Biopsied. ?Recommendation:           - Patient has a contact number available for  ?                          emergencies. The signs and symptoms of potential  ?                          delayed complications were discussed with the  ?                          patient. Return to normal activities tomorrow.  ?                          Written discharge instructions  were provided to the  ?                          patient. ?                          - Resume previous diet. ?                          - Continue present medications. ?                          - Trial course of FDGuard. ?                          - Await pathology results. ?                          - Return to GI clinic PRN. ?                          -  If reflux symptoms persist, plan for Esophageal  ?                          Manometry and pH/Impedance testing off PPI therapy. ?Doristine Locks, MD ?02/17/2022 10:18:35 AM ?

## 2022-02-21 ENCOUNTER — Telehealth: Payer: Self-pay | Admitting: *Deleted

## 2022-02-21 ENCOUNTER — Telehealth: Payer: Self-pay

## 2022-02-21 NOTE — Telephone Encounter (Signed)
First attempt, left VM.  

## 2022-02-21 NOTE — Telephone Encounter (Signed)
Attempted 2nd follow up call. No answer ?

## 2022-02-23 ENCOUNTER — Encounter: Payer: Self-pay | Admitting: Gastroenterology

## 2022-03-14 ENCOUNTER — Ambulatory Visit (INDEPENDENT_AMBULATORY_CARE_PROVIDER_SITE_OTHER): Payer: No Typology Code available for payment source | Admitting: Family

## 2022-03-14 ENCOUNTER — Encounter: Payer: Self-pay | Admitting: Family

## 2022-03-14 VITALS — BP 130/82 | HR 60 | Temp 98.1°F | Ht 63.0 in | Wt 148.0 lb

## 2022-03-14 DIAGNOSIS — R21 Rash and other nonspecific skin eruption: Secondary | ICD-10-CM

## 2022-03-14 NOTE — Progress Notes (Signed)
?  Lindsey Fields is a 51 y.o. female with the following history as recorded in EpicCare:  ?Patient Active Problem List  ? Diagnosis Date Noted  ? Sacroiliac joint dysfunction of left side 09/21/2021  ? Hypermobility syndrome 09/21/2021  ?  ?Current Outpatient Medications  ?Medication Sig Dispense Refill  ? cyclobenzaprine (FLEXERIL) 10 MG tablet Take 1 tablet (10 mg total) by mouth 3 (three) times daily as needed for muscle spasms. 30 tablet 0  ? fexofenadine (ALLEGRA) 180 MG tablet Take 180 mg by mouth daily.    ? naproxen sodium (ALEVE) 220 MG tablet Take 220 mg by mouth.    ? Probiotic Product (PROBIOTIC-10 PO) Take by mouth.    ? VITAMIN D, CHOLECALCIFEROL, PO Take by mouth.    ? ?No current facility-administered medications for this visit.  ?  ?Allergies: Metaxalone  ?Past Medical History:  ?Diagnosis Date  ? Allergy   ? Arthritis   ? GERD (gastroesophageal reflux disease)   ?  ?Past Surgical History:  ?Procedure Laterality Date  ? BREAST BIOPSY Left   ? BREAST EXCISIONAL BIOPSY Left   ? COLONOSCOPY  10/20/2011  ?  ?Family History  ?Problem Relation Age of Onset  ? Heart disease Mother   ? Colon polyps Father   ? Pancreatitis Father   ? Colon cancer Neg Hx   ? Esophageal cancer Neg Hx   ? Rectal cancer Neg Hx   ? Stomach cancer Neg Hx   ?  ?Social History  ? ?Tobacco Use  ? Smoking status: Never  ? Smokeless tobacco: Never  ?Substance Use Topics  ? Alcohol use: Yes  ?  Comment: 2 per week  ?  ?Subjective:  ? ?Localized rash noted on left breast- non itchy; noticed in past 24 hours; has had to had breast biopsy in 2014 and admits she is always nervous about her breast health;  ?Normal mammogram in 10/2021;  ? ? ?Objective:  ?Vitals:  ? 03/14/22 1545  ?BP: 130/82  ?Pulse: 60  ?Temp: 98.1 ?F (36.7 ?C)  ?TempSrc: Oral  ?SpO2: 98%  ?Weight: 148 lb (67.1 kg)  ?Height: 5\' 3"  (1.6 m)  ?  ?General: Well developed, well nourished, in no acute distress  ?Skin : Warm and dry.  ?Head: Normocephalic and atraumatic  ?Lungs:  Respirations unlabored;  ?Neurologic: Alert and oriented; speech intact; face symmetrical; moves all extremities well; CNII-XII intact without focal deficit  ?Breast exam- localized small petechiae noted on left breast ? ?Assessment:  ?1. Rash   ?  ?Plan:  ?Reassurance; will update breast ultrasound; follow up to be determined.  ? ? ?No follow-ups on file.  ?Orders Placed This Encounter  ?Procedures  ? BREAST COMPLETE UNI LEFT INC AXILLA  ?  Petechiae appearance on medial breast at 9:00 position  ?  Standing Status:   Future  ?  Standing Expiration Date:   03/15/2023  ?  Order Specific Question:   Reason for Exam (SYMPTOM  OR DIAGNOSIS REQUIRED)  ?  Answer:   breast rash  ?  Order Specific Question:   Preferred imaging location?  ?  Answer:   [facility]  ?  ?Requested Prescriptions  ? ? No prescriptions requested or ordered in this encounter  ?  ? ?

## 2022-03-21 ENCOUNTER — Encounter (HOSPITAL_BASED_OUTPATIENT_CLINIC_OR_DEPARTMENT_OTHER): Payer: Self-pay

## 2022-03-21 ENCOUNTER — Ambulatory Visit (HOSPITAL_BASED_OUTPATIENT_CLINIC_OR_DEPARTMENT_OTHER): Admission: RE | Admit: 2022-03-21 | Payer: No Typology Code available for payment source | Source: Ambulatory Visit

## 2022-03-24 ENCOUNTER — Other Ambulatory Visit: Payer: Self-pay | Admitting: Family

## 2022-03-24 DIAGNOSIS — R21 Rash and other nonspecific skin eruption: Secondary | ICD-10-CM

## 2022-03-28 ENCOUNTER — Telehealth: Payer: Self-pay | Admitting: Gastroenterology

## 2022-03-28 NOTE — Telephone Encounter (Signed)
Received a copy of medical records from previous Gastroenterologist, Dr. Lance Coon, MD at [facility].  - EGD (01/31/2016): Normal esophagus, mild gastritis (path with mild reactive gastropathy, negative for H. pylori), normal duodenum (path normal) - Colonoscopy (10/20/2011): 3 rectal polyps (path: Hyperplastic polyps).  Otherwise normal colon (path negative for microscopic colitis).  Normal TI.  Internal hemorrhoids on retroflexion.  Recommended repeat colonoscopy at age 26

## 2022-04-05 ENCOUNTER — Ambulatory Visit
Admission: RE | Admit: 2022-04-05 | Discharge: 2022-04-05 | Disposition: A | Discharge status: Home / Self Care | Payer: No Typology Code available for payment source | Source: Ambulatory Visit | Attending: Family | Admitting: Family

## 2022-04-05 ENCOUNTER — Ambulatory Visit: Payer: No Typology Code available for payment source

## 2022-04-05 DIAGNOSIS — R21 Rash and other nonspecific skin eruption: Secondary | ICD-10-CM

## 2022-10-19 ENCOUNTER — Encounter: Payer: No Typology Code available for payment source | Admitting: Family

## 2022-11-07 ENCOUNTER — Encounter: Payer: Self-pay | Admitting: Family

## 2022-11-07 ENCOUNTER — Other Ambulatory Visit (HOSPITAL_COMMUNITY)
Admission: RE | Admit: 2022-11-07 | Discharge: 2022-11-07 | Disposition: A | Discharge status: Home / Self Care | Payer: No Typology Code available for payment source | Source: Ambulatory Visit | Attending: Family | Admitting: Family

## 2022-11-07 ENCOUNTER — Ambulatory Visit (INDEPENDENT_AMBULATORY_CARE_PROVIDER_SITE_OTHER): Payer: No Typology Code available for payment source | Admitting: Family

## 2022-11-07 VITALS — BP 118/72 | HR 63 | Temp 97.9°F | Ht 63.0 in | Wt 157.8 lb

## 2022-11-07 DIAGNOSIS — Z124 Encounter for screening for malignant neoplasm of cervix: Secondary | ICD-10-CM

## 2022-11-07 DIAGNOSIS — Z0184 Encounter for antibody response examination: Secondary | ICD-10-CM | POA: Diagnosis not present

## 2022-11-07 DIAGNOSIS — Z1231 Encounter for screening mammogram for malignant neoplasm of breast: Secondary | ICD-10-CM

## 2022-11-07 DIAGNOSIS — Z13220 Encounter for screening for lipoid disorders: Secondary | ICD-10-CM

## 2022-11-07 DIAGNOSIS — Z0000 Encounter for general adult medical examination without abnormal findings: Secondary | ICD-10-CM

## 2022-11-07 DIAGNOSIS — R102 Pelvic and perineal pain: Secondary | ICD-10-CM

## 2022-11-07 DIAGNOSIS — Z1211 Encounter for screening for malignant neoplasm of colon: Secondary | ICD-10-CM | POA: Diagnosis not present

## 2022-11-07 NOTE — Progress Notes (Signed)
Lindsey Fields is a 51 y.o. 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 30 tablet 0   fexofenadine (ALLEGRA) 180 MG tablet Take 180 mg by mouth daily.     naproxen sodium (ALEVE) 220 MG tablet Take 220 mg by mouth.     Probiotic Product (PROBIOTIC-10 PO) Take by mouth.     VITAMIN D, CHOLECALCIFEROL, PO Take by mouth.     No current facility-administered medications for this visit.    Allergies: Metaxalone  Past Medical History:  Diagnosis Date   Allergy    Arthritis    GERD (gastroesophageal reflux disease)     Past Surgical History:  Procedure Laterality Date   BREAST BIOPSY Left    BREAST EXCISIONAL BIOPSY Left    COLONOSCOPY  10/20/2011   Dr. Gordon Liss.   ESOPHAGOGASTRODUODENOSCOPY  01/31/2016   Dr. Gordon Liss, [facility]: Normal esophagus, mild gastritis (path with mild reactive gastropathy, normal duodenum (path normal)    Family History  Problem Relation Age of Onset   Heart disease Mother    Colon polyps Father    Pancreatitis Father    Colon cancer Neg Hx    Esophageal cancer Neg Hx    Rectal cancer Neg Hx    Stomach cancer Neg Hx     Social History   Tobacco Use   Smoking status: Never   Smokeless tobacco: Never  Substance Use Topics   Alcohol use: Yes    Comment: 2 per week    Subjective:  Presents for yearly CPE; would like to get pap smear updated; has not had a period in over a  year and a half; concerned about some recent unexplained lower back/ pelvic pain; needs to get mammogram and colonoscopy updated;   Review of Systems  Constitutional: Negative.   HENT: Negative.    Eyes:  Negative.   Respiratory: Negative.    Cardiovascular: Negative.   Gastrointestinal: Negative.   Genitourinary: Negative.   Musculoskeletal: Negative.   Skin: Negative.   Neurological: Negative.   Endo/Heme/Allergies: Negative.   Psychiatric/Behavioral: Negative.       Objective:  Vitals:   11/07/22 1349  BP: 118/72  Pulse: 63  Temp: 97.9 F (36.6 C)  TempSrc: Oral  SpO2: 99%  Weight: 157 lb 12.8 oz (71.6 kg)  Height: 5' 3" (1.6 m)    General: Well developed, well nourished, in no acute distress  Skin : Warm and dry.  Head: Normocephalic and atraumatic  Eyes: Sclera and conjunctiva clear; pupils round and reactive to light; extraocular movements intact  Ears: External normal; canals clear; tympanic membranes normal  Oropharynx: Pink, supple. No suspicious lesions  Neck: Supple without thyromegaly, adenopathy  Lungs: Respirations unlabored; clear to auscultation bilaterally without wheeze, rales, rhonchi  CVS exam: normal rate and regular rhythm.  Abdomen: Soft; nontender; nondistended; normoactive bowel sounds; no masses or hepatosplenomegaly  Musculoskeletal: No deformities; no active joint inflammation  Extremities: No edema, cyanosis, clubbing  Vessels: Symmetric bilaterally  Neurologic: Alert and oriented; speech intact; face symmetrical; moves all extremities well; CNII-XII intact without focal deficit  Pelvic exam: normal external genitalia, vulva, vagina, cervix, uterus and adnexa.    Assessment:  1. PE (physical exam), annual   2. Encounter for screening colonoscopy   3. Visit for screening mammogram   4. Immunity status testing   5. Cervical cancer screening   6. Lipid screening   7. Pelvic pain     Plan:  Age appropriate preventive healthcare needs addressed; encouraged regular eye doctor and dental exams; encouraged regular exercise;  Order for mammogram, colonoscopy and Thin Prep pap updated as well as pelvic ultrasound; follow up to be determined;  She  will return for fasting labs at later date;   No follow-ups on file.  Orders Placed This Encounter  Procedures   MM Digital Screening    Standing Status:   Future    Standing Expiration Date:   11/08/2023    Order Specific Question:   Reason for Exam (SYMPTOM  OR DIAGNOSIS REQUIRED)    Answer:   screening mammogram    Order Specific Question:   Is the patient pregnant?    Answer:   No    Order Specific Question:   Preferred imaging location?    Answer:   [facility]   US Pelvic Complete With Transvaginal    Standing Status:   Future    Standing Expiration Date:   11/08/2023    Order Specific Question:   Reason for Exam (SYMPTOM  OR DIAGNOSIS REQUIRED)    Answer:   pelvic pain    Order Specific Question:   Preferred imaging location?    Answer:   [facility]   Varicella zoster antibody, IgG    Standing Status:   Future    Standing Expiration Date:   11/08/2023   CBC with Differential/Platelet    Standing Status:   Future    Standing Expiration Date:   11/08/2023   Comp Met (CMET)    Standing Status:   Future    Standing Expiration Date:   11/08/2023   Lipid panel    Standing Status:   Future    Standing Expiration Date:   11/08/2023   Ambulatory referral to Gastroenterology    Referral Priority:   Routine    Referral Type:   Consultation    Referral Reason:   Specialty Services Required    Referred to Provider:   Cirigliano, Vito V, DO    Number of Visits Requested:   1    Requested Prescriptions    No prescriptions requested or ordered in this encounter     

## 2022-11-10 LAB — CYTOLOGY - PAP
Comment: NEGATIVE
Diagnosis: NEGATIVE
High risk HPV: NEGATIVE

## 2022-11-14 ENCOUNTER — Telehealth (HOSPITAL_BASED_OUTPATIENT_CLINIC_OR_DEPARTMENT_OTHER): Payer: Self-pay

## 2022-11-22 ENCOUNTER — Ambulatory Visit (HOSPITAL_BASED_OUTPATIENT_CLINIC_OR_DEPARTMENT_OTHER)
Admission: RE | Admit: 2022-11-22 | Discharge: 2022-11-22 | Disposition: A | Discharge status: Home / Self Care | Payer: No Typology Code available for payment source | Source: Ambulatory Visit | Attending: Family | Admitting: Family

## 2022-11-22 ENCOUNTER — Encounter (HOSPITAL_BASED_OUTPATIENT_CLINIC_OR_DEPARTMENT_OTHER): Payer: Self-pay

## 2022-11-22 DIAGNOSIS — Z1231 Encounter for screening mammogram for malignant neoplasm of breast: Secondary | ICD-10-CM | POA: Diagnosis present

## 2022-11-22 DIAGNOSIS — R102 Pelvic and perineal pain: Secondary | ICD-10-CM

## 2022-11-28 ENCOUNTER — Other Ambulatory Visit: Payer: No Typology Code available for payment source

## 2022-12-04 ENCOUNTER — Other Ambulatory Visit (INDEPENDENT_AMBULATORY_CARE_PROVIDER_SITE_OTHER): Payer: No Typology Code available for payment source

## 2022-12-04 DIAGNOSIS — Z0184 Encounter for antibody response examination: Secondary | ICD-10-CM

## 2022-12-04 DIAGNOSIS — Z0000 Encounter for general adult medical examination without abnormal findings: Secondary | ICD-10-CM | POA: Diagnosis not present

## 2022-12-04 DIAGNOSIS — Z13220 Encounter for screening for lipoid disorders: Secondary | ICD-10-CM | POA: Diagnosis not present

## 2022-12-04 LAB — CBC WITH DIFFERENTIAL/PLATELET
Basophils Absolute: 0.1 10*3/uL (ref 0.0–0.1)
Basophils Relative: 0.9 % (ref 0.0–3.0)
Eosinophils Absolute: 0.1 10*3/uL (ref 0.0–0.7)
Eosinophils Relative: 1.5 % (ref 0.0–5.0)
HCT: 40.1 % (ref 36.0–46.0)
Hemoglobin: 13.7 g/dL (ref 12.0–15.0)
Lymphocytes Relative: 33.9 % (ref 12.0–46.0)
Lymphs Abs: 1.8 10*3/uL (ref 0.7–4.0)
MCHC: 34.1 g/dL (ref 30.0–36.0)
MCV: 93.5 fl (ref 78.0–100.0)
Monocytes Absolute: 0.3 10*3/uL (ref 0.1–1.0)
Monocytes Relative: 6 % (ref 3.0–12.0)
Neutro Abs: 3.1 10*3/uL (ref 1.4–7.7)
Neutrophils Relative %: 57.7 % (ref 43.0–77.0)
Platelets: 286 10*3/uL (ref 150.0–400.0)
RBC: 4.29 Mil/uL (ref 3.87–5.11)
RDW: 12.9 % (ref 11.5–15.5)
WBC: 5.4 10*3/uL (ref 4.0–10.5)

## 2022-12-04 LAB — COMPREHENSIVE METABOLIC PANEL
ALT: 25 U/L (ref 0–35)
AST: 23 U/L (ref 0–37)
Albumin: 4.3 g/dL (ref 3.5–5.2)
Alkaline Phosphatase: 109 U/L (ref 39–117)
BUN: 14 mg/dL (ref 6–23)
CO2: 29 mEq/L (ref 19–32)
Calcium: 9.1 mg/dL (ref 8.4–10.5)
Chloride: 102 mEq/L (ref 96–112)
Creatinine, Ser: 0.7 mg/dL (ref 0.40–1.20)
GFR: 99.74 mL/min (ref >60.00)
Glucose, Bld: 92 mg/dL (ref 70–99)
Potassium: 4.4 mEq/L (ref 3.5–5.1)
Sodium: 139 mEq/L (ref 135–145)
Total Bilirubin: 0.4 mg/dL (ref 0.2–1.2)
Total Protein: 6.7 g/dL (ref 6.0–8.3)

## 2022-12-04 LAB — LIPID PANEL
Cholesterol: 252 mg/dL — ABNORMAL HIGH (ref 0–200)
HDL: 56.5 mg/dL (ref >39.00)
LDL Cholesterol: 170 mg/dL — ABNORMAL HIGH (ref 0–99)
NonHDL: 195.88
Total CHOL/HDL Ratio: 4
Triglycerides: 130 mg/dL (ref 0.0–149.0)
VLDL: 26 mg/dL (ref 0.0–40.0)

## 2022-12-05 LAB — VARICELLA ZOSTER ANTIBODY, IGG: Varicella IgG: <135 {index} — ABNORMAL LOW

## 2022-12-06 ENCOUNTER — Ambulatory Visit: Payer: No Typology Code available for payment source | Admitting: Physician Assistant

## 2022-12-28 IMAGING — MG MM DIGITAL DIAGNOSTIC UNILAT*L* W/ TOMO W/ CAD
4 series · 4 of 12 positions shown · non-contrast
Comparison: Previous exam(s).

CLINICAL DATA: 51-year-old female with a rash on the medial left
breast which is resolved over the past 1.5 weeks.

EXAM:
DIGITAL DIAGNOSTIC UNILATERAL LEFT MAMMOGRAM WITH TOMOSYNTHESIS AND
CAD
TECHNIQUE: Left digital diagnostic mammography and breast tomosynthesis was
performed. The images were evaluated with computer-aided detection.

[L CC synth-2D]
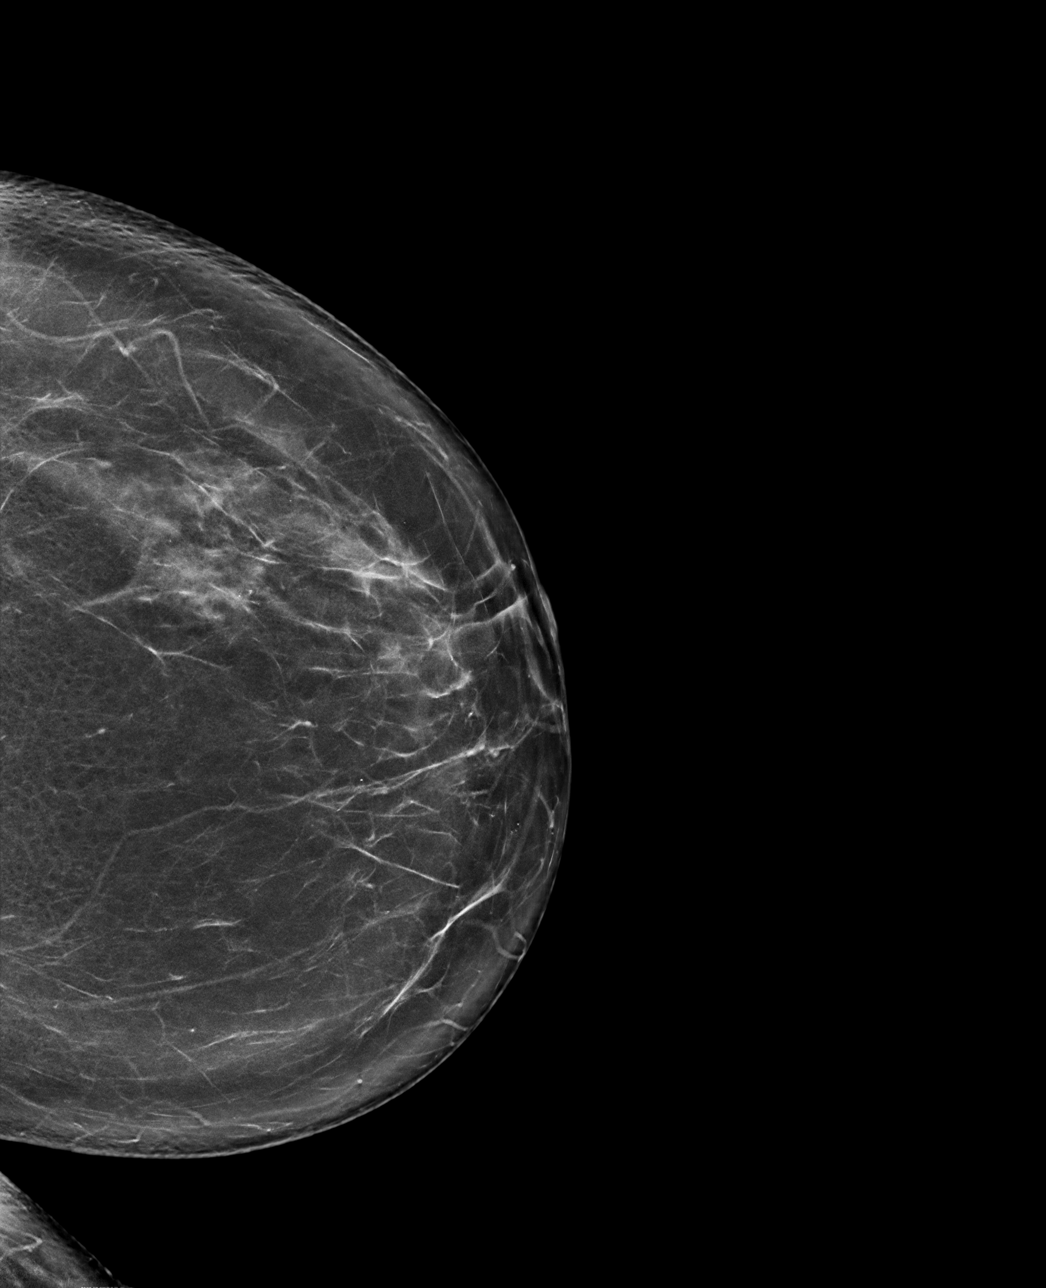

[L MLO synth-2D]
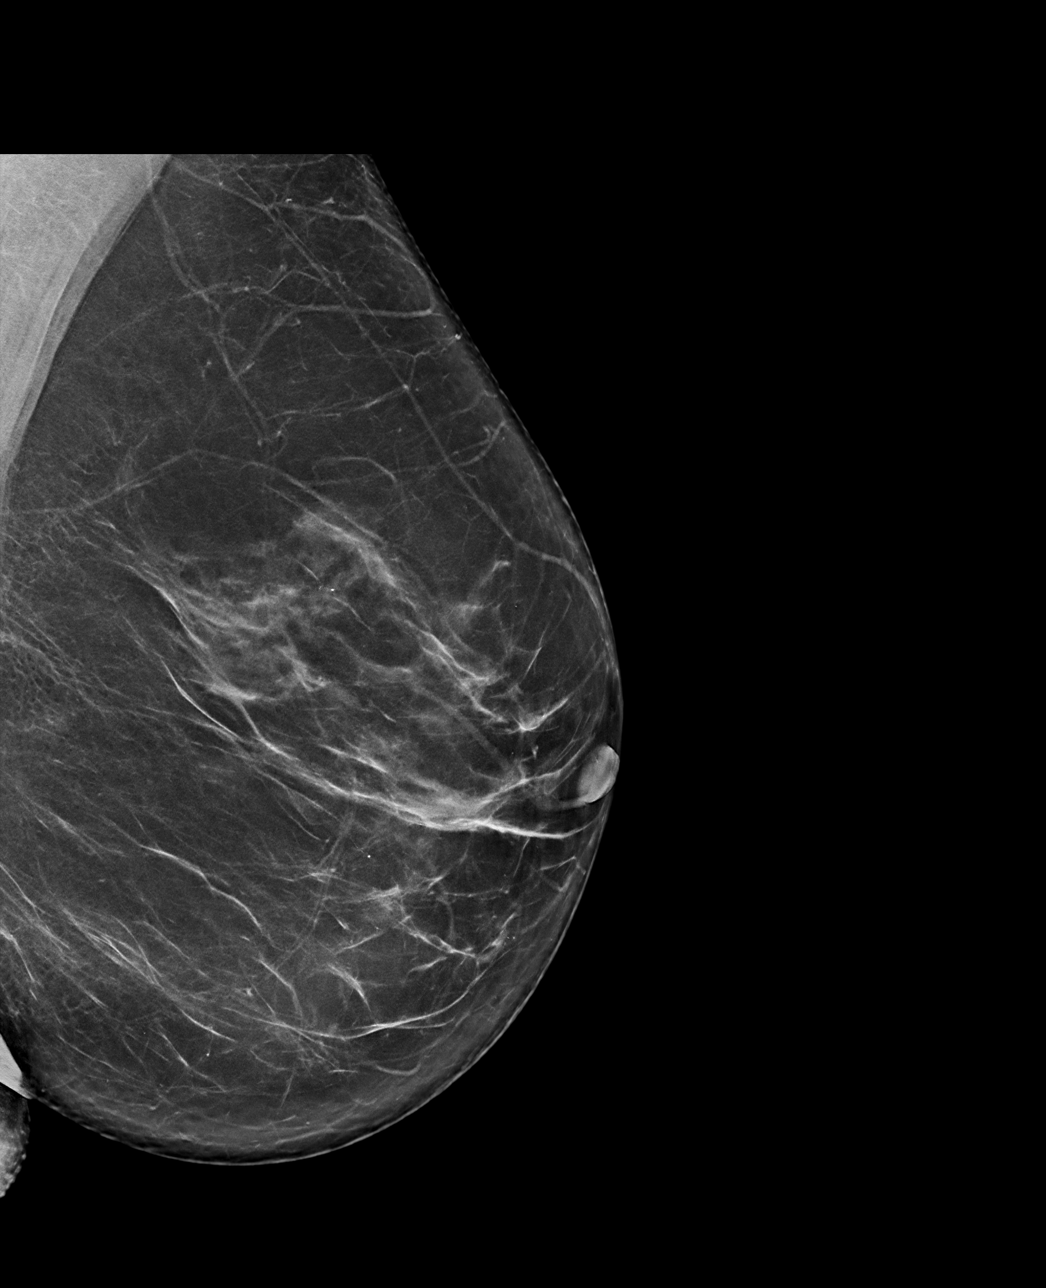

[L MLO tomo · tomo slice 43/85.0]
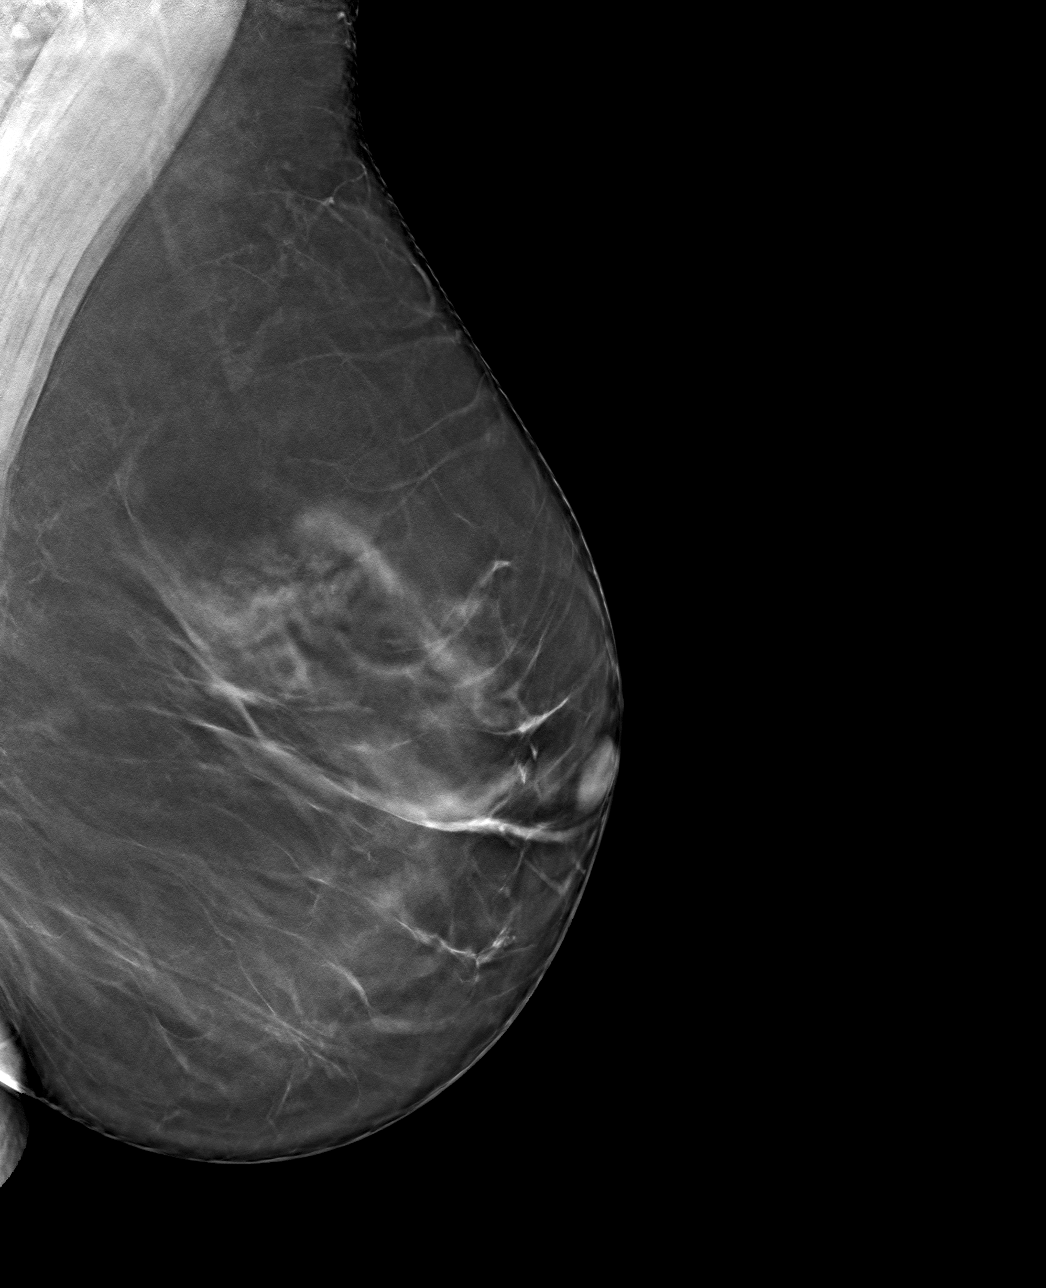

[L CC tomo · tomo slice 43/84.0]
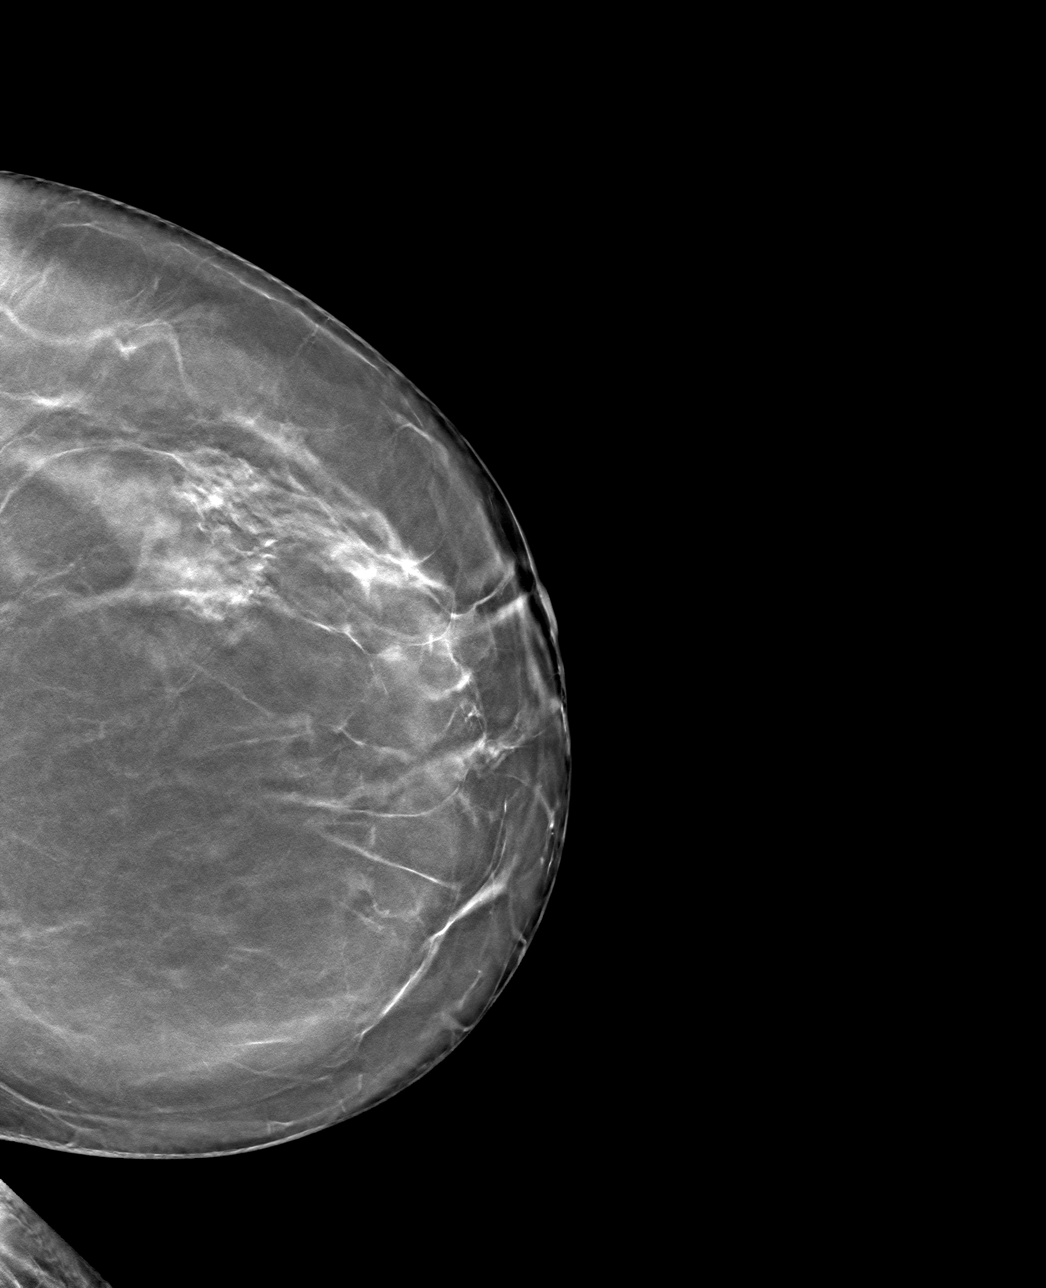

[4 of 12 positions shown; findings below may reference images not displayed]

ACR Breast Density Category b: There are scattered areas of
fibroglandular density.
FINDINGS: No focal or suspicious mammographic findings are identified in
either breast. The parenchymal pattern is stable.
IMPRESSION: No mammographic evidence of malignancy.

RECOMMENDATION:
Routine annual screening due in October 2022.

I have discussed the findings and recommendations with the patient.
If applicable, a reminder letter will be sent to the patient
regarding the next appointment.

BI-RADS CATEGORY  1: Negative.

## 2023-01-05 ENCOUNTER — Encounter: Payer: Self-pay | Admitting: Physician Assistant

## 2023-01-05 ENCOUNTER — Ambulatory Visit (INDEPENDENT_AMBULATORY_CARE_PROVIDER_SITE_OTHER): Payer: No Typology Code available for payment source | Admitting: Physician Assistant

## 2023-01-05 VITALS — BP 102/70 | HR 70 | Ht 63.0 in | Wt 155.4 lb

## 2023-01-05 DIAGNOSIS — K219 Gastro-esophageal reflux disease without esophagitis: Secondary | ICD-10-CM | POA: Diagnosis not present

## 2023-01-05 DIAGNOSIS — Z1211 Encounter for screening for malignant neoplasm of colon: Secondary | ICD-10-CM

## 2023-01-05 MED ORDER — METOCLOPRAMIDE HCL 10 MG PO TABS: 10.0000 mg | ORAL_TABLET | Freq: Four times a day (QID) | ORAL | 0 refills | Status: DC

## 2023-01-05 MED ORDER — METOCLOPRAMIDE HCL 10 MG PO TABS: 10.0000 mg | ORAL_TABLET | ORAL | 0 refills | Status: DC

## 2023-01-05 MED ORDER — CLENPIQ 10-3.5-12 MG-GM -GM/160ML PO SOLN: 1.0000 | ORAL | 0 refills | Status: DC

## 2023-01-05 NOTE — Patient Instructions (Addendum)
You have been scheduled for a colonoscopy. Please follow written instructions given to you at your visit today.  Please pick up your prep supplies at the pharmacy within the next 1-3 days. If you use inhalers (even only as needed), please bring them with you on the day of your procedure.  We have sent the following medications to your pharmacy for you to pick up at your convenience: REGLAN 10 MG: please take one tablet 30 minutes before you start your prep.  Please follow up as needed.  _______________________________________________________  If your blood pressure at your visit was 140/90 or greater, please contact your primary care physician to follow up on this.  _______________________________________________________  If you are age 33 or older, your body mass index should be between 23-30. Your There is no height or weight on file to calculate BMI. If this is out of the aforementioned range listed, please consider follow up with your Primary Care Provider.  If you are age 23 or younger, your body mass index should be between 19-25. Your There is no height or weight on file to calculate BMI. If this is out of the aformentioned range listed, please consider follow up with your Primary Care Provider.   ________________________________________________________  The [facility] providers would like to encourage you to use [facility] to communicate with providers for non-urgent requests or questions.  Due to long hold times on the telephone, sending your provider a message by [facility] may be a faster and more efficient way to get a response.  Please allow 48 business hours for a response.  Please remember that this is for non-urgent requests.  _______________________________________________________ It was a pleasure to see you today!  Thank you for trusting me with your gastrointestinal care!

## 2023-01-05 NOTE — Addendum Note (Signed)
Addended by: Beaulah Dinning R on: 01/05/2023 10:02 AM   Modules accepted: Orders

## 2023-01-05 NOTE — Progress Notes (Signed)
Chief Complaint: Screening for colorectal cancer  HPI:    Lindsey Fields is a 52 year old Caucasian female with a past medical history as listed below, known to Dr. Bryan Lemma, who presents to clinic today to discuss a screening colonoscopy.    2012 colonoscopy in [place] was normal.    02/17/2022 EGD with normal esophagus, gastroesophageal flap valve classified as Hill grade 2, normal stomach and normal duodenum.  Biopsy showed focal reactive changes in the duodenum and otherwise normal.    Today, the patient tells me that she was going to do a Cologuard last year but apparently it sat out in the sun and could not be done.  Since then she has decided that she wants to proceed with a real colonoscopy.  Does tell me the last time she prep for colonoscopy she had some trouble with dehydration and the bowel prep given that it was a gallon.    As far as reflux she continues with symptoms especially if she bends over and uses as needed Omeprazole.  This is not bothering her right now.    Denies fever, chills, weight loss or blood in her stool.  Past Medical History:  Diagnosis Date   Allergy    Arthritis    GERD (gastroesophageal reflux disease)     Past Surgical History:  Procedure Laterality Date   BREAST BIOPSY Left    BREAST EXCISIONAL BIOPSY Left    COLONOSCOPY  10/20/2011   Dr. Etheleen Sia.   ESOPHAGOGASTRODUODENOSCOPY  01/31/2016   Dr. Etheleen Sia, [place]: Normal esophagus, mild gastritis (path with mild reactive gastropathy, normal duodenum (path normal)    Current Outpatient Medications  Medication Sig Dispense Refill   cyclobenzaprine (FLEXERIL) 10 MG tablet Take 1 tablet (10 mg total) by mouth 3 (three) times daily as needed for muscle spasms. 30 tablet 0   fexofenadine (ALLEGRA) 180 MG tablet Take 180 mg by mouth daily.     naproxen sodium (ALEVE) 220 MG tablet Take 220 mg by mouth as needed.     Probiotic Product (PROBIOTIC-10 PO) Take by mouth daily.      VITAMIN D, CHOLECALCIFEROL, PO Take 1 tablet by mouth daily.     No current facility-administered medications for this visit.    Allergies as of 01/05/2023 - Review Complete 01/05/2023  Allergen Reaction Noted   Metaxalone Other (See Comments) 06/04/2021    Family History  Problem Relation Age of Onset   Heart disease Mother    Colon polyps Father    Pancreatitis Father    Colon cancer Neg Hx    Esophageal cancer Neg Hx    Rectal cancer Neg Hx    Stomach cancer Neg Hx    Pancreatic cancer Neg Hx     Social History   Socioeconomic History   Marital status: Married    Spouse name: Randall Hiss   Number of children: 2   Years of education: Not on file   Highest education level: Not on file  Occupational History   Not on file  Tobacco Use   Smoking status: Never   Smokeless tobacco: Never  Vaping Use   Vaping Use: Never used  Substance and Sexual Activity   Alcohol use: Yes    Comment: 2 per week   Drug use: Not Currently   Sexual activity: Not on file  Other Topics Concern   Not on file  Social History Narrative   Not on file   Social Determinants of Radio broadcast assistant  Strain: Not on file  Food Insecurity: Not on file  Transportation Needs: Not on file  Physical Activity: Not on file  Stress: Not on file  Social Connections: Not on file  Intimate Partner Violence: Not on file    Review of Systems:    Constitutional: No weight loss, fever or chills Cardiovascular: No chest pain Respiratory: No SOB Gastrointestinal: See HPI and otherwise negative   Physical Exam:  Vital signs: BP 102/70   Pulse 70   Ht '5\' 3"'$  (1.6 m)   Wt 155 lb 6 oz (70.5 kg)   SpO2 96%   BMI 27.52 kg/m    Constitutional:   Pleasant overweight Caucasian female appears to be in NAD, Well developed, Well nourished, alert and cooperative Respiratory: Respirations even and unlabored. Lungs clear to auscultation bilaterally.   No wheezes, crackles, or rhonchi.  Cardiovascular: Normal  S1, S2. No MRG. Regular rate and rhythm. No peripheral edema, cyanosis or pallor.  Gastrointestinal:  Soft, nondistended, nontender. No rebound or guarding. Normal bowel sounds. No appreciable masses or hepatomegaly. Rectal:  Not performed.  Psychiatric: Demonstrates good judgement and reason without abnormal affect or behaviors.  RELEVANT LABS AND IMAGING: CBC    Component Value Date/Time   WBC 5.4 12/04/2022 1101   RBC 4.29 12/04/2022 1101   HGB 13.7 12/04/2022 1101   HCT 40.1 12/04/2022 1101   PLT 286.0 12/04/2022 1101   MCV 93.5 12/04/2022 1101   MCHC 34.1 12/04/2022 1101   RDW 12.9 12/04/2022 1101   LYMPHSABS 1.8 12/04/2022 1101   MONOABS 0.3 12/04/2022 1101   EOSABS 0.1 12/04/2022 1101   BASOSABS 0.1 12/04/2022 1101    CMP     Component Value Date/Time   NA 139 12/04/2022 1101   K 4.4 12/04/2022 1101   CL 102 12/04/2022 1101   CO2 29 12/04/2022 1101   GLUCOSE 92 12/04/2022 1101   BUN 14 12/04/2022 1101   CREATININE 0.70 12/04/2022 1101   CALCIUM 9.1 12/04/2022 1101   PROT 6.7 12/04/2022 1101   ALBUMIN 4.3 12/04/2022 1101   AST 23 12/04/2022 1101   ALT 25 12/04/2022 1101   ALKPHOS 109 12/04/2022 1101   BILITOT 0.4 12/04/2022 1101    Assessment: 1.  Screening for colorectal cancer: Last colonoscopy in 2012, patient is overdue 2.  GERD: Intermittent symptoms controlled with as needed Omeprazole 20 mg, recent EGD 02/17/2022   Plan: 1. Scheduled patient for screening colonoscopy in the [place] with Dr. Bryan Lemma.  Did provide the patient a detailed list risks for the procedure and she agrees to proceed. Patient is appropriate for endoscopic procedure(s) in the ambulatory ([place]) setting.  2.  Patient had trouble with bowel prep last time.  Will give her small volume this time.  Also prescribed Reglan 10 mg to be taken 20 to 30 minutes before the first half of prep and again before the second half of prep #2 with no refills 3.  Recommend the patient do a 2-day bowel prep to  assist with the severity of her diarrhea. 4.  Also encouraged the patient to drink lots of Gatorade etc. and clear liquids to not become dehydrated. 5.  Patient to follow in clinic per recommendations from Dr. Bryan Lemma after time of procedure.  Ellouise Newer, PA-C [facility] 01/05/2023, 9:17 AM  Cc: Marrian Salvage,*

## 2023-01-12 NOTE — Progress Notes (Signed)
Agree with the assessment and plan as outlined by Jennifer Lemmon, PA-C. ? ?Ferne Ellingwood, DO, FACG ? ?

## 2023-02-15 ENCOUNTER — Encounter: Payer: Self-pay | Admitting: Gastroenterology

## 2023-02-19 ENCOUNTER — Encounter: Payer: Self-pay | Admitting: *Deleted

## 2023-02-26 ENCOUNTER — Encounter: Payer: No Typology Code available for payment source | Admitting: Gastroenterology

## 2023-03-21 ENCOUNTER — Ambulatory Visit
Admission: EM | Admit: 2023-03-21 | Discharge: 2023-03-21 | Disposition: A | Discharge status: Home / Self Care | Payer: No Typology Code available for payment source | Attending: Urgent Care | Admitting: Urgent Care

## 2023-03-21 ENCOUNTER — Ambulatory Visit (INDEPENDENT_AMBULATORY_CARE_PROVIDER_SITE_OTHER): Payer: No Typology Code available for payment source

## 2023-03-21 ENCOUNTER — Ambulatory Visit: Payer: No Typology Code available for payment source

## 2023-03-21 DIAGNOSIS — M47816 Spondylosis without myelopathy or radiculopathy, lumbar region: Secondary | ICD-10-CM | POA: Diagnosis not present

## 2023-03-21 DIAGNOSIS — M4186 Other forms of scoliosis, lumbar region: Secondary | ICD-10-CM

## 2023-03-21 DIAGNOSIS — M5416 Radiculopathy, lumbar region: Secondary | ICD-10-CM | POA: Diagnosis not present

## 2023-03-21 DIAGNOSIS — M5137 Other intervertebral disc degeneration, lumbosacral region: Secondary | ICD-10-CM | POA: Diagnosis not present

## 2023-03-21 MED ORDER — PREDNISONE 20 MG PO TABS: ORAL_TABLET | ORAL | 0 refills | Status: DC

## 2023-03-21 MED ORDER — CYCLOBENZAPRINE HCL 5 MG PO TABS: 5.0000 mg | ORAL_TABLET | Freq: Every evening | ORAL | 0 refills | Status: DC | PRN

## 2023-03-21 NOTE — ED Triage Notes (Signed)
Pt c/o lower back/ "SI flare up" x 2 days-NAD-steady with own cane

## 2023-03-21 NOTE — Discharge Instructions (Addendum)
I am prescribing you prednisone as a strong anti-inflammatory to help with your back pain. I suspect this is sciatica, lumbar radiculopathy. You can use the muscle relaxant as well. Hydrate well, practice good back care and avoid heavy lifting.

## 2023-03-21 NOTE — ED Provider Notes (Signed)
[facility]  Note:  This document was prepared using Conservation officer, historic buildings and may include unintentional dictation errors.  MRN: [id number] DOB: November 17, 1970  Subjective:   Lindsey Fields is a 52 y.o. female presenting for 2-day history of recurrent low back pain.  Feels that it is radiating to the left side and causing tightness along her left lower leg.  He is now starting to feel to go to the right side.  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  Has had intermittent back pains like this for the past 20 years.  No fall, trauma, numbness or tingling, saddle paresthesia, changes to bowel or urinary habits.   No current facility-administered medications for this encounter.  Current Outpatient Medications:    cyclobenzaprine (FLEXERIL) 10 MG tablet, Take 1 tablet (10 mg total) by mouth 3 (three) times daily as needed for muscle spasms., Disp: 30 tablet, Rfl: 0   fexofenadine (ALLEGRA) 180 MG tablet, Take 180 mg by mouth daily., Disp: , Rfl:    metoCLOPramide (REGLAN) 10 MG tablet, Take 1 tablet (10 mg total) by mouth as directed. Take 1 tablet 30 minutes prior to each half of colonoscopy prep, Disp: 2 tablet, Rfl: 0   naproxen sodium (ALEVE) 220 MG tablet, Take 220 mg by mouth as needed., Disp: , Rfl:    Probiotic Product (PROBIOTIC-10 PO), Take by mouth daily., Disp: , Rfl:    Sod Picosulfate-Mag Ox-Cit Acd (CLENPIQ) 10-3.5-12 MG-GM -GM/160ML SOLN, Take 1 kit by mouth as directed., Disp: 320 mL, Rfl: 0   VITAMIN D, CHOLECALCIFEROL, PO, Take 1 tablet by mouth daily., Disp: , Rfl:    Allergies  Allergen Reactions   Metaxalone Other (See Comments)    Altered taste in mouth    Past Medical History:  Diagnosis Date   Allergy    Arthritis    GERD (gastroesophageal reflux disease)      Past Surgical History:  Procedure Laterality Date   BREAST BIOPSY Left    BREAST EXCISIONAL BIOPSY Left    COLONOSCOPY  10/20/2011    Dr. Lance Coon.   ESOPHAGOGASTRODUODENOSCOPY  01/31/2016   Dr. Lance Coon, [facility]: Normal esophagus, mild gastritis (path with mild reactive gastropathy, normal duodenum (path normal)    Family History  Problem Relation Age of Onset   Heart disease Mother    Colon polyps Father    Pancreatitis Father    Colon cancer Neg Hx    Esophageal cancer Neg Hx    Rectal cancer Neg Hx    Stomach cancer Neg Hx    Pancreatic cancer Neg Hx     Social History   Tobacco Use   Smoking status: Never   Smokeless tobacco: Never  Vaping Use   Vaping Use: Never used  Substance Use Topics   Alcohol use: Yes    Comment: weekly   Drug use: Not Currently    ROS   Objective:   Vitals: BP 137/85 (BP Location: Right Arm)   Pulse 63   Temp 97.9 F (36.6 C) (Oral)   Resp 16   SpO2 98%   Physical Exam Constitutional:      General: She is not in acute distress.    Appearance: Normal appearance. She is well-developed. She is not ill-appearing, toxic-appearing or diaphoretic.  HENT:     Head: Normocephalic and atraumatic.     Nose: Nose normal.     Mouth/Throat:     Mouth: Mucous  membranes are moist.  Eyes:     General: No scleral icterus.       Right eye: No discharge.        Left eye: No discharge.     Extraocular Movements: Extraocular movements intact.  Cardiovascular:     Rate and Rhythm: Normal rate.  Pulmonary:     Effort: Pulmonary effort is normal.  Musculoskeletal:     Lumbar back: Spasms and tenderness (over area outlined) present. No swelling, edema, deformity, signs of trauma, lacerations or bony tenderness. Normal range of motion. Positive left straight leg raise test. Negative right straight leg raise test. No scoliosis.       Back:  Skin:    General: Skin is warm and dry.  Neurological:     General: No focal deficit present.     Mental Status: She is alert and oriented to person, place, and time.     Motor: No weakness.     Coordination:  Coordination normal.     Gait: Gait normal.     Deep Tendon Reflexes: Reflexes normal.  Psychiatric:        Mood and Affect: Mood normal.        Behavior: Behavior normal.        Thought Content: Thought content normal.        Judgment: Judgment normal.     DG Lumbar Spine Complete  Result Date: 03/21/2023 CLINICAL DATA:  Back pain, radicular pain right lower extremity EXAM: LUMBAR SPINE - COMPLETE 4+ VIEW COMPARISON:  None Available. FINDINGS: There is mild levoscoliosis. No recent fracture is seen. Alignment of posterior margins of vertebral bodies appears normal. There is disc space narrowing at the L5-S1 level. Degenerative changes are noted in facet joints. Small anterior bony spurs are noted in upper lumbar spine. IMPRESSION: No recent fracture is seen. Lumbar spondylosis. There is disc space narrowing at L5-S1 level. Electronically Signed   By: Ernie Avena M.D.   On: 03/21/2023 18:38     Assessment and Plan :   PDMP not reviewed this encounter.  1. Degenerative disc disease at L5-S1 level   2. Lumbar radiculopathy   3. Lumbar spondylosis   4. Levoscoliosis of lumbar spine    Reviewed imaging results with patient including lumbar spondylosis, degenerative disc disease.  Recommended an oral prednisone course for her degenerative disc disease. Use Flexeril for supplemental care.  Advised that if she continues to have pain especially going forward that it would be beneficial to seek a referral to a spine clinic.  Counseled patient on potential for adverse effects with medications prescribed/recommended today, ER and return-to-clinic precautions discussed, patient verbalized understanding.       Wallis Bamberg, [place] 03/22/23 [phone]

## 2023-04-04 ENCOUNTER — Telehealth: Payer: Self-pay | Admitting: Gastroenterology

## 2023-04-04 NOTE — Telephone Encounter (Signed)
PT rescheduled colonoscopy with Dr. Barron Alvine on 5/30 at 1130

## 2023-04-05 ENCOUNTER — Encounter: Payer: Self-pay | Admitting: Family

## 2023-04-05 ENCOUNTER — Encounter: Payer: No Typology Code available for payment source | Admitting: Gastroenterology

## 2023-05-01 ENCOUNTER — Ambulatory Visit: Payer: No Typology Code available for payment source | Admitting: Gastroenterology

## 2023-05-01 ENCOUNTER — Encounter: Payer: Self-pay | Admitting: Gastroenterology

## 2023-05-01 VITALS — BP 107/52 | HR 55 | Temp 97.1°F | Resp 13 | Ht 63.0 in | Wt 155.0 lb

## 2023-05-01 DIAGNOSIS — K635 Polyp of colon: Secondary | ICD-10-CM | POA: Diagnosis not present

## 2023-05-01 DIAGNOSIS — Z1211 Encounter for screening for malignant neoplasm of colon: Secondary | ICD-10-CM

## 2023-05-01 DIAGNOSIS — D122 Benign neoplasm of ascending colon: Secondary | ICD-10-CM | POA: Diagnosis not present

## 2023-05-01 DIAGNOSIS — K5730 Diverticulosis of large intestine without perforation or abscess without bleeding: Secondary | ICD-10-CM

## 2023-05-01 MED ORDER — SODIUM CHLORIDE 0.9 % IV SOLN: 500.0000 mL | Freq: Once | INTRAVENOUS | Status: DC

## 2023-05-01 NOTE — Progress Notes (Signed)
Report to PACU, RN, vss, BBS= Clear.  

## 2023-05-01 NOTE — Progress Notes (Signed)
VS by CW  Pt's states no medical or surgical changes since previsit or office visit.  

## 2023-05-01 NOTE — Progress Notes (Signed)
GASTROENTEROLOGY PROCEDURE H&P NOTE   Primary Care Physician: Olive Bass, FNP    Reason for Procedure:  Colon Cancer screening  Plan:    Colonoscopy  Patient is appropriate for endoscopic procedure(s) in the ambulatory ([facility]) setting.  The nature of the procedure, as well as the risks, benefits, and alternatives were carefully and thoroughly reviewed with the patient. Ample time for discussion and questions allowed. The patient understood, was satisfied, and agreed to proceed.     HPI: Lindsey Fields is a 52 y.o. female who presents for colonoscopy for routine Colon Cancer screening.  No active GI symptoms.  No known family history of colon cancer or related malignancy.  Patient is otherwise without complaints or active issues today.  Past Medical History:  Diagnosis Date   Allergy    Arthritis    GERD (gastroesophageal reflux disease)     Past Surgical History:  Procedure Laterality Date   BREAST BIOPSY Left    BREAST EXCISIONAL BIOPSY Left    COLONOSCOPY  10/20/2011   Dr. Lance Coon.   ESOPHAGOGASTRODUODENOSCOPY  01/31/2016   Dr. Lance Coon, [facility]: Normal esophagus, mild gastritis (path with mild reactive gastropathy, normal duodenum (path normal)    Prior to Admission medications   Medication Sig Start Date End Date Taking? Authorizing Provider  cyclobenzaprine (FLEXERIL) 5 MG tablet Take 1 tablet (5 mg total) by mouth at bedtime as needed for muscle spasms. 03/21/23  Yes Wallis Bamberg, PA-C  fexofenadine (ALLEGRA) 180 MG tablet Take 180 mg by mouth daily.   Yes [provider]  naproxen sodium (ALEVE) 220 MG tablet Take 220 mg by mouth as needed.   Yes [provider]  Probiotic Product (PROBIOTIC-10 PO) Take by mouth daily.   Yes [provider]  metoCLOPramide (REGLAN) 10 MG tablet Take 1 tablet (10 mg total) by mouth as directed. Take 1 tablet 30 minutes prior to each half of colonoscopy prep Patient not  taking: Reported on 05/01/2023 01/05/23   Unk Lightning, PA  VITAMIN D, CHOLECALCIFEROL, PO Take 1 tablet by mouth daily.    [provider]    Current Outpatient Medications  Medication Sig Dispense Refill   cyclobenzaprine (FLEXERIL) 5 MG tablet Take 1 tablet (5 mg total) by mouth at bedtime as needed for muscle spasms. 30 tablet 0   fexofenadine (ALLEGRA) 180 MG tablet Take 180 mg by mouth daily.     naproxen sodium (ALEVE) 220 MG tablet Take 220 mg by mouth as needed.     Probiotic Product (PROBIOTIC-10 PO) Take by mouth daily.     metoCLOPramide (REGLAN) 10 MG tablet Take 1 tablet (10 mg total) by mouth as directed. Take 1 tablet 30 minutes prior to each half of colonoscopy prep (Patient not taking: Reported on 05/01/2023) 2 tablet 0   VITAMIN D, CHOLECALCIFEROL, PO Take 1 tablet by mouth daily.     Current Facility-Administered Medications  Medication Dose Route Frequency Provider Last Rate Last Admin   0.9 %  sodium chloride infusion  500 mL Intravenous Once Emmette Katt V, DO        Allergies as of 05/01/2023 - Review Complete 05/01/2023  Allergen Reaction Noted   Metaxalone Other (See Comments) 06/04/2021    Family History  Problem Relation Age of Onset   Heart disease Mother    Colon polyps Father    Pancreatitis Father    Colon cancer Neg Hx    Esophageal cancer Neg Hx    Rectal  cancer Neg Hx    Stomach cancer Neg Hx    Pancreatic cancer Neg Hx     Social History   Socioeconomic History   Marital status: Married    Spouse name: Minerva Areola   Number of children: 2   Years of education: Not on file   Highest education level: Not on file  Occupational History   Not on file  Tobacco Use   Smoking status: Never   Smokeless tobacco: Never  Vaping Use   Vaping Use: Never used  Substance and Sexual Activity   Alcohol use: Yes    Comment: weekly   Drug use: Not Currently   Sexual activity: Not on file  Other Topics Concern   Not on file  Social  History Narrative   Not on file   Social Determinants of Health   Financial Resource Strain: Not on file  Food Insecurity: Not on file  Transportation Needs: Not on file  Physical Activity: Not on file  Stress: Not on file  Social Connections: Not on file  Intimate Partner Violence: Not on file    Physical Exam: Vital signs in last 24 hours: @BP  131/81   Pulse 64   Temp (!) 97.1 F (36.2 C)   Ht 5\' 3"  (1.6 m)   Wt 155 lb (70.3 kg)   SpO2 98%   BMI 27.46 kg/m  GEN: NAD EYE: Sclerae anicteric ENT: MMM CV: Non-tachycardic Pulm: CTA b/l GI: Soft, NT/ND NEURO:  Alert & Oriented x 3   Doristine Locks, DO [facility]   05/01/2023 8:24 AM

## 2023-05-01 NOTE — Progress Notes (Signed)
Called to room to assist during endoscopic procedure.  Patient ID and intended procedure confirmed with present staff. Received instructions for my participation in the procedure from the performing physician.  

## 2023-05-01 NOTE — Op Note (Signed)
[facility] Patient Name: Lindsey Fields Procedure Date: 05/01/2023 8:21 AM MRN: [id number] Endoscopist: Doristine Locks , MD, [id number] Age: 52 Referring MD:  Date of Birth: [birth year] Gender: Female Account #: [id number] Procedure:                Colonoscopy Indications:              Screening for colorectal malignant neoplasm, This                            is the patient's first colonoscopy Medicines:                Monitored Anesthesia Care Procedure:                Pre-Anesthesia Assessment:                           - Prior to the procedure, a History and Physical                            was performed, and patient medications and                            allergies were reviewed. The patient's tolerance of                            previous anesthesia was also reviewed. The risks                            and benefits of the procedure and the sedation                            options and risks were discussed with the patient.                            All questions were answered, and informed consent                            was obtained. Prior Anticoagulants: The patient has                            taken no anticoagulant or antiplatelet agents. ASA                            Grade Assessment: II - A patient with mild systemic                            disease. After reviewing the risks and benefits,                            the patient was deemed in satisfactory condition to                            undergo the procedure.  After obtaining informed consent, the colonoscope                            was passed under direct vision. Throughout the                            procedure, the patient's blood pressure, pulse, and                            oxygen saturations were monitored continuously. The                            Olympus CF-HQ190L ([id number]) Colonoscope was                            introduced through the anus  and advanced to the the                            cecum, identified by appendiceal orifice and                            ileocecal valve. The colonoscopy was performed                            without difficulty. The patient tolerated the                            procedure well. The quality of the bowel                            preparation was good. The ileocecal valve,                            appendiceal orifice, and rectum were photographed. Scope In: 8:29:54 AM Scope Out: 8:48:24 AM Scope Withdrawal Time: 0 hours 11 minutes 45 seconds  Total Procedure Duration: 0 hours 18 minutes 30 seconds  Findings:                 The perianal and digital rectal examinations were                            normal.                           A 12 mm polyp was found in the ascending colon. The                            polyp was flat. The polyp was removed with a cold                            snare. Resection and retrieval were complete.                            Estimated blood loss was minimal.  A few medium-mouthed diverticula were found in the                            ascending colon.                           The exam was otherwise normal throughout the                            remainder of the colon.                           The retroflexed view of the distal rectum and anal                            verge was normal and showed no anal or rectal                            abnormalities. Complications:            No immediate complications. Estimated Blood Loss:     Estimated blood loss was minimal. Impression:               - One 12 mm polyp in the ascending colon, removed                            with a cold snare. Resected and retrieved.                           - Diverticulosis in the ascending colon.                           - The distal rectum and anal verge are normal on                            retroflexion view. Recommendation:            - Patient has a contact number available for                            emergencies. The signs and symptoms of potential                            delayed complications were discussed with the                            patient. Return to normal activities tomorrow.                            Written discharge instructions were provided to the                            patient.                           - Resume previous diet.                           -  Continue present medications.                           - Await pathology results.                           - Repeat colonoscopy for surveillance based on                            pathology results.                           - Return to GI office PRN. Doristine Locks, MD 05/01/2023 8:53:59 AM

## 2023-05-01 NOTE — Patient Instructions (Signed)
    Handouts on polyps & diverticulosis given to you today  Await pathology results on polyps removed     YOU HAD AN ENDOSCOPIC PROCEDURE TODAY AT THE [facility]:   Refer to the procedure report that was given to you for any specific questions about what was found during the examination.  If the procedure report does not answer your questions, please call your gastroenterologist to clarify.  If you requested that your care partner not be given the details of your procedure findings, then the procedure report has been included in a sealed envelope for you to review at your convenience later.  YOU SHOULD EXPECT: Some feelings of bloating in the abdomen. Passage of more gas than usual.  Walking can help get rid of the air that was put into your GI tract during the procedure and reduce the bloating. If you had a lower endoscopy (such as a colonoscopy or flexible sigmoidoscopy) you may notice spotting of blood in your stool or on the toilet paper. If you underwent a bowel prep for your procedure, you may not have a normal bowel movement for a few days.  Please Note:  You might notice some irritation and congestion in your nose or some drainage.  This is from the oxygen used during your procedure.  There is no need for concern and it should clear up in a day or so.  SYMPTOMS TO REPORT IMMEDIATELY:  Following lower endoscopy (colonoscopy or flexible sigmoidoscopy):  Excessive amounts of blood in the stool  Significant tenderness or worsening of abdominal pains  Swelling of the abdomen that is new, acute  Fever of 100F or higher   For urgent or emergent issues, a gastroenterologist can be reached at any hour by calling [phone]. Do not use MyChart messaging for urgent concerns.    DIET:  We do recommend a small meal at first, but then you may proceed to your regular diet.  Drink plenty of fluids but you should avoid alcoholic beverages for 24 hours.  ACTIVITY:  You should  plan to take it easy for the rest of today and you should NOT DRIVE or use heavy machinery until tomorrow (because of the sedation medicines used during the test).    FOLLOW UP: Our staff will call the number listed on your records the next business day following your procedure.  We will call around 7:15- 8:00 am to check on you and address any questions or concerns that you may have regarding the information given to you following your procedure. If we do not reach you, we will leave a message.     If any biopsies were taken you will be contacted by phone or by letter within the next 1-3 weeks.  Please call us at [phone] if you have not heard about the biopsies in 3 weeks.    SIGNATURES/CONFIDENTIALITY: You and/or your care partner have signed paperwork which will be entered into your electronic medical record.  These signatures attest to the fact that that the information above on your After Visit Summary has been reviewed and is understood.  Full responsibility of the confidentiality of this discharge information lies with you and/or your care-partner.  

## 2023-05-02 ENCOUNTER — Telehealth: Payer: Self-pay

## 2023-05-02 NOTE — Telephone Encounter (Signed)
  Follow up Call-     05/01/2023    7:58 AM 02/17/2022    9:26 AM  Call back number  Post procedure Call Back phone  # [phone]  Permission to leave phone message Yes Yes     Patient questions:  Do you have a fever, pain , or abdominal swelling? No. Pain Score  0 *  Have you tolerated food without any problems? Yes.    Have you been able to return to your normal activities? Yes.    Do you have any questions about your discharge instructions: Diet   No. Medications  No. Follow up visit  No.  Do you have questions or concerns about your Care? No.  Actions: * If pain score is 4 or above: No action needed, pain <4.

## 2023-05-09 ENCOUNTER — Other Ambulatory Visit: Payer: Self-pay | Admitting: Family

## 2023-05-09 ENCOUNTER — Encounter: Payer: Self-pay | Admitting: Family

## 2023-05-09 DIAGNOSIS — Z86010 Personal history of colonic polyps: Secondary | ICD-10-CM

## 2023-05-22 ENCOUNTER — Other Ambulatory Visit: Payer: Self-pay | Admitting: Orthopedic Surgery

## 2023-05-22 DIAGNOSIS — M4156 Other secondary scoliosis, lumbar region: Secondary | ICD-10-CM

## 2023-05-22 DIAGNOSIS — M5432 Sciatica, left side: Secondary | ICD-10-CM

## 2023-05-22 DIAGNOSIS — M41124 Adolescent idiopathic scoliosis, thoracic region: Secondary | ICD-10-CM

## 2023-05-22 DIAGNOSIS — Z6827 Body mass index (BMI) 27.0-27.9, adult: Secondary | ICD-10-CM

## 2023-05-28 ENCOUNTER — Encounter: Payer: Self-pay | Admitting: Orthopedic Surgery

## 2023-05-29 ENCOUNTER — Encounter: Payer: Self-pay | Admitting: Orthopedic Surgery

## 2023-05-30 ENCOUNTER — Ambulatory Visit
Admission: RE | Admit: 2023-05-30 | Discharge: 2023-05-30 | Disposition: A | Discharge status: Home / Self Care | Payer: No Typology Code available for payment source | Source: Ambulatory Visit | Attending: Orthopedic Surgery | Admitting: Orthopedic Surgery

## 2023-05-30 DIAGNOSIS — Z6827 Body mass index (BMI) 27.0-27.9, adult: Secondary | ICD-10-CM

## 2023-05-30 DIAGNOSIS — M5432 Sciatica, left side: Secondary | ICD-10-CM

## 2023-05-30 DIAGNOSIS — M41124 Adolescent idiopathic scoliosis, thoracic region: Secondary | ICD-10-CM

## 2023-05-30 DIAGNOSIS — M4156 Other secondary scoliosis, lumbar region: Secondary | ICD-10-CM

## 2023-06-18 ENCOUNTER — Ambulatory Visit (INDEPENDENT_AMBULATORY_CARE_PROVIDER_SITE_OTHER): Payer: No Typology Code available for payment source | Admitting: Physician Assistant

## 2023-06-18 ENCOUNTER — Encounter: Payer: Self-pay | Admitting: Physician Assistant

## 2023-06-18 VITALS — BP 122/68 | HR 70 | Temp 98.2°F | Resp 20 | Wt 153.0 lb

## 2023-06-18 DIAGNOSIS — L299 Pruritus, unspecified: Secondary | ICD-10-CM | POA: Diagnosis not present

## 2023-06-18 DIAGNOSIS — S2001XA Contusion of right breast, initial encounter: Secondary | ICD-10-CM | POA: Diagnosis not present

## 2023-06-18 DIAGNOSIS — N644 Mastodynia: Secondary | ICD-10-CM

## 2023-06-18 NOTE — Progress Notes (Signed)
Established patient visit   Patient: Lindsey Fields   DOB: Aug 10, 1971   52 y.o. Female  MRN: [id number] Visit Date: 06/18/2023  Today's healthcare provider: Alfredia Ferguson, PA-C   Cc. Right breast concerns  Subjective    HPI  Pt reports a bruise on her right breast, slightly sore, denies injury that she noticed yesterday. Last week felt cyst under right armpit, warm compresses and it helped.  Reports an itchy, rash underneath b/l breasts x 1 week. Medications: Outpatient Medications Prior to Visit  Medication Sig   cyclobenzaprine (FLEXERIL) 5 MG tablet Take 1 tablet (5 mg total) by mouth at bedtime as needed for muscle spasms.   fexofenadine (ALLEGRA) 180 MG tablet Take 180 mg by mouth daily.   metoCLOPramide (REGLAN) 10 MG tablet Take 1 tablet (10 mg total) by mouth as directed. Take 1 tablet 30 minutes prior to each half of colonoscopy prep   naproxen sodium (ALEVE) 220 MG tablet Take 220 mg by mouth as needed.   Probiotic Product (PROBIOTIC-10 PO) Take by mouth daily.   VITAMIN D, CHOLECALCIFEROL, PO Take 1 tablet by mouth daily.   No facility-administered medications prior to visit.    Review of Systems  Constitutional:  Negative for fatigue and fever.  Respiratory:  Negative for cough and shortness of breath.   Cardiovascular:  Negative for chest pain and leg swelling.  Gastrointestinal:  Negative for abdominal pain.  Neurological:  Negative for dizziness and headaches.      Objective    BP 122/68 (BP Location: Left Arm, Patient Position: Sitting, Cuff Size: Normal)   Pulse 70   Temp 98.2 F (36.8 C)   Resp 20   Wt 153 lb (69.4 kg)   SpO2 97%   BMI 27.10 kg/m   Physical Exam Vitals reviewed.  Constitutional:      Appearance: She is not ill-appearing.  HENT:     Head: Normocephalic.  Eyes:     Conjunctiva/sclera: Conjunctivae normal.  Cardiovascular:     Rate and Rhythm: Normal rate.  Pulmonary:     Effort: Pulmonary effort is normal. No  respiratory distress.  Chest:     Comments: B/l breasts with no rashes, dimpling, no nipple inversion or discharge b/l.   Right breast 5:00 5- 7 CMFN (fairly far) there is a ~1cm bruise. Underneath there is  < 5mm well circumscribed mobile, soft mass vs area of more circumscribed breast tissue. Neurological:     General: No focal deficit present.     Mental Status: She is alert and oriented to person, place, and time.  Psychiatric:        Mood and Affect: Mood normal.        Behavior: Behavior normal.      No results found for any visits on 06/18/23.  Assessment & Plan     1. Breast pain, right 2. Contusion of right breast, initial encounter I do not appreciated anything concerning on exam, either very small hematoma or fat necrosis  Ordered ultrasound for completion. Last mammo 1/24.  - [place] LIMITED ULTRASOUND INCLUDING AXILLA RIGHT BREAST   3. Itching Recommending pt keep area clean and dry, use topical hydrocortisone prn. No visible rash  Return if symptoms worsen or fail to improve.      I, Alfredia Ferguson, PA-C have reviewed all documentation for this visit. The documentation on  06/18/23   for the exam, diagnosis, procedures, and orders are all accurate and complete.    Alfredia Ferguson,  PA-C  [facility] at [facility] [phone] (phone) [phone] (fax)  [facility]

## 2023-07-11 ENCOUNTER — Encounter: Payer: Self-pay | Admitting: Physician Assistant

## 2023-07-11 NOTE — Addendum Note (Signed)
Addended byAlfredia Ferguson on: 07/11/2023 08:49 AM   Modules accepted: Orders

## 2023-07-16 ENCOUNTER — Other Ambulatory Visit: Payer: Self-pay | Admitting: Physician Assistant

## 2023-07-16 DIAGNOSIS — N644 Mastodynia: Secondary | ICD-10-CM

## 2023-07-16 DIAGNOSIS — S2001XA Contusion of right breast, initial encounter: Secondary | ICD-10-CM

## 2023-07-18 ENCOUNTER — Ambulatory Visit
Admission: RE | Admit: 2023-07-18 | Discharge: 2023-07-18 | Disposition: A | Discharge status: Home / Self Care | Payer: No Typology Code available for payment source | Source: Ambulatory Visit | Attending: Internal Medicine | Admitting: Internal Medicine

## 2023-07-18 VITALS — BP 125/82 | HR 76 | Temp 99.4°F | Resp 16

## 2023-07-18 DIAGNOSIS — R051 Acute cough: Secondary | ICD-10-CM

## 2023-07-18 DIAGNOSIS — B349 Viral infection, unspecified: Secondary | ICD-10-CM

## 2023-07-18 DIAGNOSIS — U071 COVID-19: Secondary | ICD-10-CM | POA: Insufficient documentation

## 2023-07-18 NOTE — ED Triage Notes (Signed)
Pt presents to UC w/ c/o fever, nasal congestion, eyes watery, cough, body aches, ear pressure x4 days. Pt has tried Mucinex, xlear nose spray, immunity liquid vitamin with some relief. Pt tested positive with a home covid test yesterday. Pt states her job needs her to have a work note.

## 2023-07-18 NOTE — Discharge Instructions (Signed)
The clinic will contact you with results of the COVID test done today if positive.  Continue over-the-counter cough/cold medicine.  Lots of rest and fluids.  Please follow-up with your PCP in 2 days for recheck.  Please go to the ER for any worsening symptoms.  I hope you feel better soon!

## 2023-07-18 NOTE — ED Provider Notes (Signed)
[facility]    CSN: [id number] Arrival date & time: 07/18/23  1021      History   Chief Complaint No chief complaint on file.   HPI Lindsey Fields is a 52 y.o. female  presents for evaluation of URI symptoms for 4 days. Patient reports associated symptoms of cough, congestion, fevers, body aches, ear pain, watery eyes. Denies N/V/D, sore throat, shortness of breath. Patient does not have a hx of asthma or smoking. No known sick contacts.  Reports a positive home COVID test yesterday.  States she has had COVID in the past without complication or hospitalization.  She did receive vaccinations for COVID in the past.  Pt has taken cough cold medicine OTC for symptoms. Pt has no other concerns at this time.   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 Lance Coon.   ESOPHAGOGASTRODUODENOSCOPY  01/31/2016   Dr. Lance Coon, [facility]: Normal esophagus, mild gastritis (path with mild reactive gastropathy, normal duodenum (path normal)    OB History   No obstetric history on file.      Home Medications    Prior to Admission medications   Medication Sig Start Date End Date Taking? Authorizing Provider  cyclobenzaprine (FLEXERIL) 5 MG tablet Take 1 tablet (5 mg total) by mouth at bedtime as needed for muscle spasms. 03/21/23   Wallis Bamberg, PA-C  fexofenadine (ALLEGRA) 180 MG tablet Take 180 mg by mouth daily.    [provider]  metoCLOPramide (REGLAN) 10 MG tablet Take 1 tablet (10 mg total) by mouth as directed. Take 1 tablet 30 minutes prior to each half of colonoscopy prep 01/05/23   Unk Lightning, PA  naproxen sodium (ALEVE) 220 MG tablet Take 220 mg by mouth as needed.    [provider]  Probiotic Product (PROBIOTIC-10 PO) Take by mouth daily.    [provider]  VITAMIN D, CHOLECALCIFEROL, PO Take 1 tablet by mouth daily.    [provider]    Family History Family History  Problem Relation Age of Onset   Heart disease Mother    Colon polyps Father    Pancreatitis Father    Colon cancer Neg Hx    Esophageal cancer Neg Hx    Rectal cancer Neg Hx    Stomach cancer Neg Hx    Pancreatic cancer Neg Hx     Social History Social History   Tobacco Use   Smoking status: Never   Smokeless tobacco: Never  Vaping Use   Vaping status: Never Used  Substance Use Topics   Alcohol use: Yes    Comment: weekly   Drug use: Not Currently     Allergies   Metaxalone   Review of Systems Review of Systems  Constitutional:  Positive for fever.  HENT:  Positive for congestion and rhinorrhea.   Eyes:        Watery eyes  Respiratory:  Positive for cough.   Musculoskeletal:  Positive for myalgias.     Physical Exam Triage Vital Signs ED Triage Vitals  Encounter Vitals Group  BP 07/18/23 1031 125/82     Systolic BP Percentile --      Diastolic BP Percentile --      Pulse Rate 07/18/23 1031 76     Resp 07/18/23 1031 16     Temp 07/18/23 1031 99.4 F (37.4 C)     Temp Source 07/18/23 1031 Oral     SpO2 07/18/23 1031 95 %     Weight --      Height --      Head Circumference --      Peak Flow --      Pain Score 07/18/23 1035 0     Pain Loc --      Pain Education --      Exclude from Growth Chart --    No data found.  Updated Vital Signs BP 125/82 (BP Location: Right Arm)   Pulse 76   Temp 99.4 F (37.4 C) (Oral)   Resp 16   SpO2 95%   Visual Acuity Right Eye Distance:   Left Eye Distance:   Bilateral Distance:    Right Eye Near:   Left Eye Near:    Bilateral Near:     Physical Exam Vitals and nursing note  reviewed.  Constitutional:      General: She is not in acute distress.    Appearance: She is well-developed. She is not ill-appearing.  HENT:     Head: Normocephalic and atraumatic.     Right Ear: Tympanic membrane and ear canal normal.     Left Ear: Tympanic membrane and ear canal normal.     Nose: Congestion present.     Mouth/Throat:     Mouth: Mucous membranes are moist.     Pharynx: Oropharynx is clear. Uvula midline. No oropharyngeal exudate or posterior oropharyngeal erythema.     Tonsils: No tonsillar exudate or tonsillar abscesses.  Eyes:     Conjunctiva/sclera: Conjunctivae normal.     Pupils: Pupils are equal, round, and reactive to light.  Cardiovascular:     Rate and Rhythm: Normal rate and regular rhythm.     Heart sounds: Normal heart sounds.  Pulmonary:     Effort: Pulmonary effort is normal.     Breath sounds: Normal breath sounds.  Musculoskeletal:     Cervical back: Normal range of motion and neck supple.  Lymphadenopathy:     Cervical: No cervical adenopathy.  Skin:    General: Skin is warm and dry.  Neurological:     General: No focal deficit present.     Mental Status: She is alert and oriented to person, place, and time.  Psychiatric:        Mood and Affect: Mood normal.        Behavior: Behavior normal.      UC Treatments / Results  Labs (all labs ordered are listed, but only abnormal results are displayed) Labs Reviewed  SARS CORONAVIRUS 2 (TAT 6-24 HRS)    EKG   Radiology No results found.  Procedures Procedures (including critical care time)  Medications Ordered in UC Medications - No data to display  Initial Impression / Assessment and Plan / UC Course  I have reviewed the triage vital signs and the nursing notes.  Pertinent labs & imaging results that were available during my care of the patient were reviewed by me and considered in my medical decision making (see chart for details).     Reviewed exam and symptoms with  patient.  No red flags.  Patient  with COVID type symptoms with reported positive home COVID test.  Will do COVID test for confirmation.  She declines treatment with Paxlovid.  Discussed viral illness and continued symptomatic treatment.  PCP follow-up 2 days for recheck.  ER precautions reviewed and patient verbalized understanding. Final Clinical Impressions(s) / UC Diagnoses   Final diagnoses:  Acute cough  Viral illness     Discharge Instructions      The clinic will contact you with results of the COVID test done today if positive.  Continue over-the-counter cough/cold medicine.  Lots of rest and fluids.  Please follow-up with your PCP in 2 days for recheck.  Please go to the ER for any worsening symptoms.  I hope you feel better soon!     ED Prescriptions   None    PDMP not reviewed this encounter.   Radford Pax, NP 07/18/23 1052

## 2023-07-19 LAB — SARS CORONAVIRUS 2 (TAT 6-24 HRS): SARS Coronavirus 2: POSITIVE — AB

## 2023-07-26 ENCOUNTER — Other Ambulatory Visit: Payer: No Typology Code available for payment source

## 2023-08-02 ENCOUNTER — Ambulatory Visit
Admission: RE | Admit: 2023-08-02 | Discharge: 2023-08-02 | Disposition: A | Discharge status: Home / Self Care | Payer: No Typology Code available for payment source | Source: Ambulatory Visit | Attending: Physician Assistant | Admitting: Physician Assistant

## 2023-08-02 ENCOUNTER — Other Ambulatory Visit: Payer: No Typology Code available for payment source

## 2023-08-02 DIAGNOSIS — N644 Mastodynia: Secondary | ICD-10-CM

## 2023-08-02 DIAGNOSIS — S2001XA Contusion of right breast, initial encounter: Secondary | ICD-10-CM

## 2023-12-05 ENCOUNTER — Other Ambulatory Visit: Payer: Self-pay

## 2023-12-05 ENCOUNTER — Emergency Department (HOSPITAL_BASED_OUTPATIENT_CLINIC_OR_DEPARTMENT_OTHER)
Admission: EM | Admit: 2023-12-05 | Discharge: 2023-12-05 | Disposition: A | Discharge status: Home / Self Care | Payer: BC Managed Care – PPO | Attending: Emergency Medicine | Admitting: Emergency Medicine

## 2023-12-05 ENCOUNTER — Emergency Department (HOSPITAL_BASED_OUTPATIENT_CLINIC_OR_DEPARTMENT_OTHER): Payer: BC Managed Care – PPO

## 2023-12-05 ENCOUNTER — Encounter (HOSPITAL_BASED_OUTPATIENT_CLINIC_OR_DEPARTMENT_OTHER): Payer: Self-pay | Admitting: Emergency Medicine

## 2023-12-05 DIAGNOSIS — R0789 Other chest pain: Secondary | ICD-10-CM | POA: Diagnosis not present

## 2023-12-05 DIAGNOSIS — R079 Chest pain, unspecified: Secondary | ICD-10-CM | POA: Insufficient documentation

## 2023-12-05 LAB — BASIC METABOLIC PANEL
Anion gap: 5 (ref 5–15)
BUN: 12 mg/dL (ref 6–20)
CO2: 29 mmol/L (ref 22–32)
Calcium: 9.1 mg/dL (ref 8.9–10.3)
Chloride: 103 mmol/L (ref 98–111)
Creatinine, Ser: 0.68 mg/dL (ref 0.44–1.00)
GFR, Estimated: >60 mL/min (ref >60)
Glucose, Bld: 104 mg/dL — ABNORMAL HIGH (ref 70–99)
Potassium: 4 mmol/L (ref 3.5–5.1)
Sodium: 137 mmol/L (ref 135–145)

## 2023-12-05 LAB — CBC
HCT: 43.6 % (ref 36.0–46.0)
Hemoglobin: 14.2 g/dL (ref 12.0–15.0)
MCH: 30.1 pg (ref 26.0–34.0)
MCHC: 32.6 g/dL (ref 30.0–36.0)
MCV: 92.6 fL (ref 80.0–100.0)
Platelets: 304 10*3/uL (ref 150–400)
RBC: 4.71 MIL/uL (ref 3.87–5.11)
RDW: 12.2 % (ref 11.5–15.5)
WBC: 7.7 10*3/uL (ref 4.0–10.5)
nRBC: 0 % (ref 0.0–0.2)

## 2023-12-05 LAB — TROPONIN I (HIGH SENSITIVITY)
Troponin I (High Sensitivity): <2 ng/L (ref <18)
Troponin I (High Sensitivity): <2 ng/L (ref <18)

## 2023-12-05 NOTE — ED Triage Notes (Signed)
C/o central CP starting around 1030. Rads into left ear/neck. No cardiac hx.Denies SHOB. Appear anxious in triage.

## 2023-12-05 NOTE — ED Provider Notes (Signed)
[facility] Provider Note   CSN: [id number] Arrival date & time: 12/05/23  1107     History  Chief Complaint  Patient presents with   Chest Pain    Lindsey Fields is a 53 y.o. female.  She has no significant past medical history.  She said she was at work today when she felt a pressure squeezing sensation in her ears radiating down to her neck and into her chest.  She said it was fairly intense and lasted about an hour.  It is now resolved.  She went to urgent care but the wait was so long so she came here for further evaluation.  She has a family history of heart disease but no personal history.  No recent illness.  No shortness of breath diaphoresis nausea or vomiting.  She does endorse stress but she does not think there was anything particular that happened today that was worse than usual.  She said she had 1 prior episode a few months ago that resolved on its own and she did not get evaluated  The history is provided by the patient.  Chest Pain Pain location:  Substernal area Pain quality: pressure   Pain radiates to:  Neck Pain severity:  Moderate Onset quality:  Sudden Duration:  1 hour Timing:  Constant Progression:  Resolved Chronicity:  Recurrent Relieved by:  Rest Worsened by:  Nothing Ineffective treatments:  None tried Associated symptoms: no abdominal pain, no cough, no diaphoresis, no fever, no nausea, no numbness, no shortness of breath, no vomiting and no weakness        Home Medications Prior to Admission medications   Medication Sig Start Date End Date Taking? Authorizing Provider  cyclobenzaprine (FLEXERIL) 5 MG tablet Take 1 tablet (5 mg total) by mouth at bedtime as needed for muscle spasms. 03/21/23   Wallis Bamberg, PA-C  fexofenadine (ALLEGRA) 180 MG tablet Take 180 mg by mouth daily.    [provider]  metoCLOPramide (REGLAN) 10 MG tablet Take 1 tablet (10 mg total) by mouth as directed. Take 1  tablet 30 minutes prior to each half of colonoscopy prep 01/05/23   Unk Lightning, PA  naproxen sodium (ALEVE) 220 MG tablet Take 220 mg by mouth as needed.    [provider]  Probiotic Product (PROBIOTIC-10 PO) Take by mouth daily.    [provider]  VITAMIN D, CHOLECALCIFEROL, PO Take 1 tablet by mouth daily.    [provider]      Allergies    Metaxalone    Review of Systems   Review of Systems  Constitutional:  Negative for diaphoresis and fever.  Respiratory:  Negative for cough and shortness of breath.   Cardiovascular:  Positive for chest pain.  Gastrointestinal:  Negative for abdominal pain, nausea and vomiting.  Neurological:  Negative for weakness and numbness.    Physical Exam Updated Vital Signs BP (!) 165/99 (BP Location: Left Arm)   Pulse 66   Temp 97.6 F (36.4 C) (Oral)   Resp 19   SpO2 100%  Physical Exam Vitals and nursing note reviewed.  Constitutional:      General: She is not in acute distress.    Appearance: Normal appearance. She is well-developed.  HENT:     Head: Normocephalic and atraumatic.  Eyes:     Conjunctiva/sclera: Conjunctivae normal.  Cardiovascular:     Rate and Rhythm: Normal rate and regular rhythm.     Heart sounds:  Normal heart sounds. No murmur heard. Pulmonary:     Effort: Pulmonary effort is normal. No respiratory distress.     Breath sounds: Normal breath sounds.  Abdominal:     Palpations: Abdomen is soft.     Tenderness: There is no abdominal tenderness.  Musculoskeletal:        General: No swelling. Normal range of motion.     Cervical back: Neck supple.     Right lower leg: No tenderness. No edema.     Left lower leg: No tenderness. No edema.  Skin:    General: Skin is warm and dry.     Capillary Refill: Capillary refill takes less than 2 seconds.  Neurological:     General: No focal deficit present.     Mental Status: She is alert.     Motor: No weakness.     ED Results /  Procedures / Treatments   Labs (all labs ordered are listed, but only abnormal results are displayed) Labs Reviewed  BASIC METABOLIC PANEL - Abnormal; Notable for the following components:      Result Value   Glucose, Bld 104 (*)    All other components within normal limits  CBC  TROPONIN I (HIGH SENSITIVITY)  TROPONIN I (HIGH SENSITIVITY)    EKG EKG Interpretation Date/Time:  Wednesday December 05 2023 11:19:53 EST Ventricular Rate:  67 PR Interval:  119 QRS Duration:  103 QT Interval:  400 QTC Calculation: 423 R Axis:   56  Text Interpretation: Sinus rhythm Borderline short PR interval Low voltage, precordial leads Abnormal R-wave progression, early transition No old tracing to compare Confirmed by Meridee Score [phone]) on 12/05/2023 11:22:51 AM  Radiology DG Chest 2 View Result Date: 12/05/2023 CLINICAL DATA:  Central chest pain. Lateral chest pain extending to left ear/neck. EXAM: CHEST - 2 VIEW COMPARISON:  None Available. FINDINGS: Cardiac silhouette and mediastinal contours are within normal limits. The lungs are clear. No pleural effusion or pneumothorax. Mild multilevel degenerative disc changes of the thoracic spine. IMPRESSION: No active cardiopulmonary disease. Electronically Signed   By: Neita Garnet M.D.   On: 12/05/2023 13:16    Procedures Procedures    Medications Ordered in ED Medications - No data to display  ED Course/ Medical Decision Making/ A&P Clinical Course as of 12/05/23 1734  Wed Dec 05, 2023  1255 Chest x-ray interpreted by me as no acute infiltrates.  Awaiting radiology reading. [MB]    Clinical Course User Index [MB] Terrilee Files, MD                                 Medical Decision Making Amount and/or Complexity of Data Reviewed Labs: ordered. Radiology: ordered.   This patient complains of pressure in her neck and chest; this involves an extensive number of treatment Options and is a complaint that carries with it a high risk  of complications and morbidity. The differential includes ACS, gas pain, pneumonia, pneumothorax, PE, vascular  I ordered, reviewed and interpreted labs, which included CBC normal chemistries normal troponins flat I ordered imaging studies which included chest x-ray and I independently    visualized and interpreted imaging which showed no acute findings Previous records obtained and reviewed in epic no recent admissions Cardiac monitoring reviewed, normal sinus rhythm Social determinants considered, no significant barriers Critical Interventions: None  After the interventions stated above, I reevaluated the patient and found feeling back to baseline and  hemodynamically stable Admission and further testing considered, no indications for admission or further workup at this time.  Recommended close follow-up with PCP.  Return instructions discussed         Final Clinical Impression(s) / ED Diagnoses Final diagnoses:  Nonspecific chest pain    Rx / DC Orders ED Discharge Orders     None         Terrilee Files, MD 12/05/23 1736

## 2023-12-05 NOTE — Discharge Instructions (Signed)
You were seen in the emergency department for an episode of pressure in your head neck chest.  You had blood work EKG chest x-ray that did not show any significant findings.  There is no evidence of heart attack.  Please follow-up with your primary care doctor for further evaluation.  Return to the emergency department if any worsening or concerning symptoms

## 2023-12-14 ENCOUNTER — Telehealth: Payer: BC Managed Care – PPO | Admitting: Family

## 2023-12-14 ENCOUNTER — Ambulatory Visit: Payer: BC Managed Care – PPO | Admitting: Family

## 2023-12-14 VITALS — BP 130/82 | HR 64 | Ht 63.0 in | Wt 155.8 lb

## 2023-12-14 DIAGNOSIS — M249 Joint derangement, unspecified: Secondary | ICD-10-CM

## 2023-12-14 DIAGNOSIS — R0789 Other chest pain: Secondary | ICD-10-CM

## 2023-12-14 NOTE — Progress Notes (Signed)
 Lindsey Fields is a 53 y.o. 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 30 tablet 0   fexofenadine (ALLEGRA) 180 MG tablet Take 180 mg by mouth daily.     metoCLOPramide  (REGLAN ) 10 MG tablet Take 1 tablet (10 mg total) by mouth as directed. Take 1 tablet 30 minutes prior to each half of colonoscopy prep 2 tablet 0   naproxen sodium (ALEVE) 220 MG tablet Take 220 mg by mouth as needed.     Probiotic Product (PROBIOTIC-10 PO) Take by mouth daily.     VITAMIN D , CHOLECALCIFEROL, PO Take 1 tablet by mouth daily.     No current facility-administered medications for this visit.    Allergies: Metaxalone  Past Medical History:  Diagnosis Date   Allergy    Arthritis    GERD (gastroesophageal reflux disease)     Past Surgical History:  Procedure Laterality Date   BREAST BIOPSY Left    BREAST EXCISIONAL BIOPSY Left    COLONOSCOPY  10/20/2011   Dr. Vaughn Hertz.   ESOPHAGOGASTRODUODENOSCOPY  01/31/2016   Dr. Vaughn Hertz, [facility]: Normal esophagus, mild gastritis (path with mild reactive gastropathy, normal duodenum (path normal)    Family History  Problem Relation Age of Onset   Heart disease Mother    Colon polyps Father    Pancreatitis Father    Colon cancer Neg Hx    Esophageal cancer Neg Hx    Rectal cancer Neg Hx    Stomach cancer Neg Hx    Pancreatic cancer Neg Hx     Social History   Tobacco Use   Smoking status: Never   Smokeless tobacco: Never  Substance Use Topics   Alcohol use: Yes    Comment: weekly    Subjective:   Was seen at the ER on 1/29 with squeezing sensation in ears/ jaws/ upper chest while sitting  at her desk; notes she has had this episode one other time; did have cardiac stress test about 5 years ago which was normal; no changes in bowel movements;     Objective:  Vitals:   12/14/23 1308  BP: 130/82  Pulse: 64  SpO2: 98%  Weight: 155 lb 12.8 oz (70.7 kg)  Height: 5' 3 (1.6 m)    General: Well developed, well nourished, in no acute distress  Skin : Warm and dry.  Head: Normocephalic and atraumatic  Eyes: Sclera and conjunctiva clear; pupils round and reactive to light; extraocular movements intact  Ears: External normal; canals clear; tympanic membranes normal  Oropharynx: Pink, supple. No suspicious lesions  Neck: Supple without thyromegaly, adenopathy  Lungs: Respirations unlabored; clear to auscultation bilaterally without wheeze, rales, rhonchi  CVS exam: normal rate and regular rhythm.  Neurologic: Alert and oriented; speech intact; face symmetrical; moves all extremities well; CNII-XII intact without focal deficit   Assessment:  1. Hypermobility of joint   2. Atypical chest pain     Plan:  Reviewed recent ER notes- reassuring; has had cardiac stress test in past 5 years; will have her meet with sports medicine provider to discuss possible manipulation and see if this improves sensation on left side of body;  No follow-ups on file.  Orders Placed This Encounter  Procedures   Ambulatory referral to Sports Medicine    Referral Priority:   Routine    Referral Type:   Consultation    Referred to Provider:   Claudene Arthea HERO, DO    Number of Visits Requested:   1    Requested Prescriptions    No prescriptions requested or ordered in this encounter

## 2023-12-19 ENCOUNTER — Ambulatory Visit
Admission: RE | Admit: 2023-12-19 | Discharge: 2023-12-19 | Disposition: A | Discharge status: Home / Self Care | Payer: BC Managed Care – PPO | Source: Ambulatory Visit | Attending: Physician Assistant | Admitting: Physician Assistant

## 2023-12-19 ENCOUNTER — Encounter: Payer: Self-pay | Admitting: Family

## 2023-12-19 ENCOUNTER — Telehealth: Payer: Self-pay

## 2023-12-19 ENCOUNTER — Other Ambulatory Visit (HOSPITAL_BASED_OUTPATIENT_CLINIC_OR_DEPARTMENT_OTHER): Payer: Self-pay

## 2023-12-19 VITALS — BP 116/80 | HR 79 | Temp 99.0°F | Resp 18

## 2023-12-19 DIAGNOSIS — R21 Rash and other nonspecific skin eruption: Secondary | ICD-10-CM | POA: Diagnosis not present

## 2023-12-19 DIAGNOSIS — R509 Fever, unspecified: Secondary | ICD-10-CM | POA: Diagnosis not present

## 2023-12-19 LAB — POCT INFLUENZA A/B
Influenza A, POC: NEGATIVE
Influenza B, POC: NEGATIVE

## 2023-12-19 MED ORDER — ACYCLOVIR 400 MG PO TABS
800.0000 mg | ORAL_TABLET | Freq: Four times a day (QID) | ORAL | 0 refills | Status: AC
  Filled 2023-12-19: qty 40, 5d supply, fill #0

## 2023-12-19 NOTE — Discharge Instructions (Addendum)
Testing for chicken pox is pending.  Tylenol for fever Return if any problems.

## 2023-12-19 NOTE — ED Triage Notes (Signed)
Patient c/o fever x 3 days, now patient has developed a rash on left side of her back.  Rash started yesterday, somewhat itchy.  Patient has taken Aleve.

## 2023-12-19 NOTE — Telephone Encounter (Signed)
Patient called for work note. Provider notified. Note printed by provider. Placed note at front desk for patient pick up. Patient notified.

## 2023-12-19 NOTE — ED Provider Notes (Signed)
[facility]    CSN: [id number] Arrival date & time: 12/19/23  4098      History   Chief Complaint Chief Complaint  Patient presents with   Fever    I came down with flu like symptoms on Monday day and my fever fluctuates between 101-103.  I also have nausea I have body aches, a headache and now a rash on my trunk.Assume it's the flu it need to be checked out. - Entered by patient    HPI Lindsey Fields is a 53 y.o. female.   Patient complains of fever and congestion for the past 3 days.  Patient reports yesterday she began developing a rash.  Patient reports she has been exposed to a coworker who has shingles.  Patient reports that she has never had chickenpox and she is concerned that she may have chickenpox.  Patient denies any shortness of breath.  Patient has had some bodyaches.  The history is provided by the patient. No language interpreter was used.  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 Lance Coon.   ESOPHAGOGASTRODUODENOSCOPY  01/31/2016   Dr. Lance Coon, [facility]: Normal esophagus, mild gastritis (path with mild reactive gastropathy, normal duodenum (path normal)    OB History   No obstetric history on file.      Home Medications    Prior to Admission medications   Medication Sig Start Date End Date Taking? Authorizing Provider  acyclovir (ZOVIRAX) 800 MG tablet Take 1 tablet (800 mg total) by mouth 4 (four) times daily for 5 days. 12/19/23 12/24/23 Yes Elson Areas, PA-C  fexofenadine (ALLEGRA) 180 MG tablet Take 180 mg by mouth daily.   Yes [provider]  naproxen sodium (ALEVE) 220 MG tablet Take 220 mg by mouth as needed.   Yes [provider]  Probiotic Product (PROBIOTIC-10 PO) Take by mouth daily.   Yes [provider]  VITAMIN D, CHOLECALCIFEROL, PO Take 1 tablet by mouth daily.   Yes [provider]  cyclobenzaprine (FLEXERIL) 5 MG tablet Take 1 tablet (5 mg total) by mouth at bedtime as needed for muscle spasms. 03/21/23   Wallis Bamberg, PA-C  metoCLOPramide (REGLAN) 10 MG tablet Take 1 tablet (10 mg total) by mouth as directed. Take 1 tablet 30 minutes prior to each half of colonoscopy prep 01/05/23   Unk Lightning, PA    Family History Family History  Problem Relation Age of Onset   Heart disease Mother    Colon polyps Father    Pancreatitis Father    Colon cancer Neg Hx    Esophageal cancer Neg Hx    Rectal cancer Neg Hx    Stomach cancer Neg Hx    Pancreatic cancer Neg Hx     Social History Social History   Tobacco Use   Smoking status: Never   Smokeless tobacco: Never  Vaping Use   Vaping status: Never Used  Substance Use Topics   Alcohol use: Yes    Comment: weekly   Drug use: Not Currently  Allergies   Metaxalone   Review of Systems Review of Systems  Constitutional:  Positive for fever.  All other systems reviewed and are negative.    Physical Exam Triage Vital Signs ED Triage Vitals  Encounter Vitals Group     BP 12/19/23 0903 116/80     Systolic BP Percentile --      Diastolic BP Percentile --      Pulse Rate 12/19/23 0903 79     Resp 12/19/23 0903 18     Temp 12/19/23 0903 99 F (37.2 C)     Temp Source 12/19/23 0903 Oral     SpO2 12/19/23 0903 91 %     Weight --      Height --      Head Circumference --      Peak Flow --      Pain Score 12/19/23 0905 0     Pain Loc --      Pain Education --      Exclude from Growth Chart --    No data found.  Updated Vital Signs BP 116/80 (BP Location: Left Arm)   Pulse 79   Temp 99 F (37.2 C) (Oral)    Resp 18   SpO2 91%   Visual Acuity Right Eye Distance:   Left Eye Distance:   Bilateral Distance:    Right Eye Near:   Left Eye Near:    Bilateral Near:     Physical Exam Vitals and nursing note reviewed.  Constitutional:      Appearance: She is well-developed.  HENT:     Head: Normocephalic.  Cardiovascular:     Rate and Rhythm: Normal rate.  Pulmonary:     Effort: Pulmonary effort is normal.  Abdominal:     General: There is no distension.  Musculoskeletal:        General: Normal range of motion.     Cervical back: Normal range of motion.  Skin:    Findings: Rash present.     Comments: Patient has several red raised areas with raised centers.  Neurological:     General: No focal deficit present.     Mental Status: She is alert and oriented to person, place, and time.      UC Treatments / Results  Labs (all labs ordered are listed, but only abnormal results are displayed) Labs Reviewed  POCT INFLUENZA A/B - Normal  VARICELLA-ZOSTER BY PCR    EKG   Radiology No results found.  Procedures Procedures (including critical care time)  Medications Ordered in UC Medications - No data to display  Initial Impression / Assessment and Plan / UC Course  I have reviewed the triage vital signs and the nursing notes.  Pertinent labs & imaging results that were available during my care of the patient were reviewed by me and considered in my medical decision making (see chart for details).     Rash is concerning for chickenpox.  Some of the areas appear to be vesicles.  Some of the other areas are just appearing to erupt.   I ask Dr. Linford Arnold who is a family practice physician next-door to look at the rash as well.  She also feels patient could have varicella.  Patient tested for influenza and influenza is negative.  I will start patient on acyclovir.  I have sent skin testing for varicella PCR.  Patient is advised to follow-up with her physician for recheck. Final  Clinical Impressions(s) / UC Diagnoses   Final diagnoses:  Rash and nonspecific skin eruption  Fever, unspecified     Discharge Instructions      Testing for chicken pox is pending.  Tylenol for fever Return if any problems.    ED Prescriptions     Medication Sig Dispense Auth. Provider   acyclovir (ZOVIRAX) 800 MG tablet Take 1 tablet (800 mg total) by mouth 4 (four) times daily for 5 days. 20 tablet Elson Areas, [place]      PDMP not reviewed this encounter.   Elson Areas, [place] 12/19/23 1027

## 2023-12-21 ENCOUNTER — Other Ambulatory Visit (HOSPITAL_BASED_OUTPATIENT_CLINIC_OR_DEPARTMENT_OTHER): Payer: Self-pay

## 2023-12-21 ENCOUNTER — Telehealth (HOSPITAL_BASED_OUTPATIENT_CLINIC_OR_DEPARTMENT_OTHER): Payer: Self-pay

## 2023-12-21 LAB — VARICELLA-ZOSTER BY PCR: Varicella-Zoster, PCR: POSITIVE — AB

## 2023-12-21 MED ORDER — ACYCLOVIR 800 MG PO TABS
800.0000 mg | ORAL_TABLET | Freq: Every day | ORAL | 0 refills | Status: AC
  Filled 2023-12-21: qty 5, 1d supply, fill #0

## 2023-12-21 NOTE — Telephone Encounter (Signed)
Reviewed with patient, verified pharmacy, prescription sent

## 2023-12-21 NOTE — Telephone Encounter (Signed)
-----   Message from [facility] sent at 12/21/2023  1:30 PM EST ----- Patient has chickenpox.  Patient should continue acyclovir but she should take it 5 times daily, not 4.  Please send a prescription for 5 more tablets of acyclovir 800 mg 5 times daily so that she can complete the full 5-day course of treatment.  Thank you. ----- Message ----- From: Warren Danes, RN Sent: 12/21/2023  12:41 PM EST To: Elson Areas, PA-C; #  Hello, please advise if any updates are needed for pt. Thank you.

## 2023-12-25 ENCOUNTER — Telehealth: Payer: BC Managed Care – PPO | Admitting: Family

## 2023-12-28 ENCOUNTER — Telehealth (INDEPENDENT_AMBULATORY_CARE_PROVIDER_SITE_OTHER): Payer: BC Managed Care – PPO | Admitting: Family

## 2023-12-28 ENCOUNTER — Encounter: Payer: Self-pay | Admitting: Family

## 2023-12-28 ENCOUNTER — Other Ambulatory Visit: Payer: Self-pay | Admitting: Family

## 2023-12-28 VITALS — Ht 63.0 in

## 2023-12-28 DIAGNOSIS — Z0184 Encounter for antibody response examination: Secondary | ICD-10-CM

## 2023-12-28 DIAGNOSIS — B019 Varicella without complication: Secondary | ICD-10-CM

## 2023-12-28 NOTE — Progress Notes (Signed)
 Lindsey Fields is a 53 y.o. 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 30 tablet 0   fexofenadine (ALLEGRA) 180 MG tablet Take 180 mg by mouth daily.     naproxen sodium (ALEVE) 220 MG tablet Take 220 mg by mouth as needed.     Probiotic Product (PROBIOTIC-10 PO) Take by mouth daily.     VITAMIN D, CHOLECALCIFEROL, PO Take 1 tablet by mouth daily.     No current facility-administered medications for this visit.    Allergies: Metaxalone  Past Medical History:  Diagnosis Date   Allergy    Arthritis    GERD (gastroesophageal reflux disease)     Past Surgical History:  Procedure Laterality Date   BREAST BIOPSY Left    BREAST EXCISIONAL BIOPSY Left    COLONOSCOPY  10/20/2011   Dr. Lance Coon.   ESOPHAGOGASTRODUODENOSCOPY  01/31/2016   Dr. Lance Coon, [facility]: Normal esophagus, mild gastritis (path with mild reactive gastropathy, normal duodenum (path normal)    Family History  Problem Relation Age of Onset   Heart disease Mother    Colon polyps Father    Pancreatitis Father    Colon cancer Neg Hx    Esophageal cancer Neg Hx    Rectal cancer Neg Hx    Stomach cancer Neg Hx    Pancreatic cancer Neg Hx     Social History   Tobacco Use   Smoking status: Never   Smokeless tobacco: Never  Substance Use Topics   Alcohol use: Yes    Comment: weekly    Subjective:    I connected with Donavan Foil on 12/28/23 at  2:20 PM EST by a video enabled telemedicine application and verified that I am speaking with the correct person using two identifiers.   I discussed the limitations of evaluation and management by  telemedicine and the availability of in person appointments. The patient expressed understanding and agreed to proceed. Provider in office/ patient is at home; provider and patient are only 2 people on video call.   Patient was diagnosed with chicken pox on 12/19/23 at [facility] visit; notes that she is feeling very good and has not had any fevers for the past week; is requesting a note to return to work on Monday with no restrictions;  Is also concerned about status of other childhood vaccines and wonders about options to get tested;   Objective:  Vitals:   12/28/23 1414  Height: 5\' 3"  (1.6 m)    General: Well developed, well nourished, in no acute distress  Skin : Warm and dry.  Head: Normocephalic and atraumatic  Lungs: Respirations unlabored;  Neurologic: Alert and oriented; speech intact; face symmetrical; moves all extremities well; CNII-XII intact without focal deficit   Assessment:  1. Varicella without complication     Plan:  Patient has done well with treatment and recovering with no complications; work note given as requested; She will return to get her titers for MMR checked at her convenience;   No follow-ups on file.  No orders of the defined types were placed in this encounter.   Requested Prescriptions    No prescriptions requested or ordered  in this encounter

## 2024-01-01 ENCOUNTER — Ambulatory Visit: Payer: BC Managed Care – PPO | Admitting: Family Medicine

## 2024-01-02 ENCOUNTER — Encounter: Payer: Self-pay | Admitting: Family

## 2024-01-03 ENCOUNTER — Other Ambulatory Visit: Payer: Self-pay | Admitting: Family

## 2024-01-03 ENCOUNTER — Other Ambulatory Visit (INDEPENDENT_AMBULATORY_CARE_PROVIDER_SITE_OTHER): Payer: BC Managed Care – PPO

## 2024-01-03 ENCOUNTER — Encounter: Payer: Self-pay | Admitting: Family

## 2024-01-03 DIAGNOSIS — Z0184 Encounter for antibody response examination: Secondary | ICD-10-CM | POA: Diagnosis not present

## 2024-01-03 DIAGNOSIS — R300 Dysuria: Secondary | ICD-10-CM

## 2024-01-03 LAB — URINALYSIS, ROUTINE W REFLEX MICROSCOPIC
Bilirubin Urine: NEGATIVE
Hgb urine dipstick: NEGATIVE
Ketones, ur: NEGATIVE
Nitrite: NEGATIVE
Specific Gravity, Urine: 1.02 (ref 1.000–1.030)
Total Protein, Urine: NEGATIVE
Urine Glucose: NEGATIVE
Urobilinogen, UA: 0.2 (ref 0.0–1.0)
pH: 6 (ref 5.0–8.0)

## 2024-01-03 MED ORDER — NITROFURANTOIN MONOHYD MACRO 100 MG PO CAPS: 100.0000 mg | ORAL_CAPSULE | Freq: Two times a day (BID) | ORAL | 0 refills | Status: DC

## 2024-01-04 LAB — MEASLES/MUMPS/RUBELLA IMMUNITY
Mumps IgG: 280 [AU]/ml
Rubella: 3.38 {index}
Rubeola IgG: 48.6 [AU]/ml

## 2024-01-04 LAB — URINE CULTURE
MICRO NUMBER:: 16137862
Result:: NO GROWTH
SPECIMEN QUALITY:: ADEQUATE

## 2024-01-29 NOTE — Progress Notes (Deleted)
 [facility] [address] [place] [address] Phone: [phone] Subjective:    I'm seeing this patient by the request  of:  Olive Bass, FNP  CC:   [id number]  [facility] is a 53 y.o. female coming in with complaint of SI jt dysfunction. Patient saw Clare Gandy in 2022. Patient states       Past Medical History:  Diagnosis Date   Allergy    Arthritis    GERD (gastroesophageal reflux disease)    Past Surgical History:  Procedure Laterality Date   BREAST BIOPSY Left    BREAST EXCISIONAL BIOPSY Left    COLONOSCOPY  10/20/2011   Dr. Lance Coon.   ESOPHAGOGASTRODUODENOSCOPY  01/31/2016   Dr. Lance Coon, [facility]: Normal esophagus, mild gastritis (path with mild reactive gastropathy, normal duodenum (path normal)   Social History   Socioeconomic History   Marital status: Married    Spouse name: Minerva Areola   Number of children: 2   Years of education: Not on file   Highest education level: Master's degree (e.g., MA, MS, MEng, MEd, MSW, MBA)  Occupational History   Not on file  Tobacco Use   Smoking status: Never   Smokeless tobacco: Never  Vaping Use   Vaping status: Never Used  Substance and Sexual Activity   Alcohol use: Yes    Comment: weekly   Drug use: Not Currently   Sexual activity: Yes    Birth control/protection: None  Other Topics Concern   Not on file  Social History Narrative   Not on file   Social Drivers of Health   Financial Resource Strain: Low Risk  (12/14/2023)   Overall Financial Resource Strain (CARDIA)    Difficulty of Paying Living Expenses: Not very hard  Food Insecurity: No Food Insecurity (12/14/2023)   Hunger Vital Sign    Worried About Running Out of Food in the Last Year: Never true    Ran Out of Food in the Last Year: Never true  Transportation Needs: No Transportation Needs (12/14/2023)   PRAPARE - Administrator, Civil Service (Medical): No    Lack  of Transportation (Non-Medical): No  Physical Activity: Unknown (12/14/2023)   Exercise Vital Sign    Days of Exercise per Week: 0 days    Minutes of Exercise per Session: Not on file  Stress: No Stress Concern Present (12/14/2023)   [employer] of Occupational Health - Occupational Stress Questionnaire    Feeling of Stress : Only a little  Social Connections: Socially Integrated (12/14/2023)   Social Connection and Isolation Panel [NHANES]    Frequency of Communication with Friends and Family: More than three times a week    Frequency of Social Gatherings with Friends and Family: Three times a week    Attends Religious Services: More than 4 times per year    Active Member of Clubs or Organizations: Yes    Attends Engineer, structural: More than 4 times per year    Marital Status: Married   Allergies  Allergen Reactions   Metaxalone Other (See Comments)    Altered taste in mouth   Family History  Problem Relation Age of Onset   Heart disease Mother    Colon polyps Father    Pancreatitis Father    Colon cancer Neg Hx    Esophageal cancer Neg Hx    Rectal cancer Neg Hx    Stomach cancer Neg Hx    Pancreatic cancer  Neg Hx       Current Outpatient Medications (Respiratory):    fexofenadine (ALLEGRA) 180 MG tablet, Take 180 mg by mouth daily.  Current Outpatient Medications (Analgesics):    naproxen sodium (ALEVE) 220 MG tablet, Take 220 mg by mouth as needed.   Current Outpatient Medications (Other):    cyclobenzaprine (FLEXERIL) 5 MG tablet, Take 1 tablet (5 mg total) by mouth at bedtime as needed for muscle spasms.   nitrofurantoin, macrocrystal-monohydrate, (MACROBID) 100 MG capsule, Take 1 capsule (100 mg total) by mouth 2 (two) times daily.   Probiotic Product (PROBIOTIC-10 PO), Take by mouth daily.   VITAMIN D, CHOLECALCIFEROL, PO, Take 1 tablet by mouth daily.   Reviewed prior external information including notes and imaging from  primary care  provider As well as notes that were available from care everywhere and other healthcare systems.  Past medical history, social, surgical and family history all reviewed in electronic medical record.  No pertanent information unless stated regarding to the chief complaint.   Review of Systems:  No headache, visual changes, nausea, vomiting, diarrhea, constipation, dizziness, abdominal pain, skin rash, fevers, chills, night sweats, weight loss, swollen lymph nodes, body aches, joint swelling, chest pain, shortness of breath, mood changes. POSITIVE muscle aches  Objective  There were no vitals taken for this visit.   General: No apparent distress alert and oriented x3 mood and affect normal, dressed appropriately.  HEENT: Pupils equal, extraocular movements intact  Respiratory: Patient's speak in full sentences and does not appear short of breath  Cardiovascular: No lower extremity edema, non tender, no erythema      Impression and Recommendations:

## 2024-01-30 ENCOUNTER — Ambulatory Visit: Admitting: Family Medicine

## 2024-02-14 ENCOUNTER — Other Ambulatory Visit: Payer: Self-pay | Admitting: Family

## 2024-02-15 ENCOUNTER — Encounter: Payer: Self-pay | Admitting: Family

## 2024-02-15 ENCOUNTER — Ambulatory Visit: Admitting: Family

## 2024-02-15 VITALS — BP 112/72 | HR 76 | Temp 98.0°F | Resp 18 | Ht 63.0 in | Wt 151.0 lb

## 2024-02-15 DIAGNOSIS — L819 Disorder of pigmentation, unspecified: Secondary | ICD-10-CM | POA: Diagnosis not present

## 2024-02-15 NOTE — Progress Notes (Signed)
 [facility] is a 53 y.o. 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 30 tablet 0   fexofenadine (ALLEGRA) 180 MG tablet Take 180 mg by mouth daily.     naproxen sodium (ALEVE) 220 MG tablet Take 220 mg by mouth as needed.     Probiotic Product (PROBIOTIC-10 PO) Take by mouth daily.     VITAMIN D, CHOLECALCIFEROL, PO Take 1 tablet by mouth daily.     No current facility-administered medications for this visit.    Allergies: Metaxalone  Past Medical History:  Diagnosis Date   Allergy    Arthritis    GERD (gastroesophageal reflux disease)     Past Surgical History:  Procedure Laterality Date   BREAST BIOPSY Left    BREAST EXCISIONAL BIOPSY Left    COLONOSCOPY  10/20/2011   Dr. Briant Camper.   ESOPHAGOGASTRODUODENOSCOPY  01/31/2016   Dr. Briant Camper, [facility]: Normal esophagus, mild gastritis (path with mild reactive gastropathy, normal duodenum (path normal)    Family History  Problem Relation Age of Onset   Heart disease Mother    Colon polyps Father    Pancreatitis Father    Colon cancer Neg Hx    Esophageal cancer Neg Hx    Rectal cancer Neg Hx    Stomach cancer Neg Hx    Pancreatic cancer Neg Hx     Social History   Tobacco Use   Smoking status: Never   Smokeless tobacco: Never  Substance Use Topics   Alcohol use: Yes    Comment: weekly    Subjective:  Requesting referral to dermatology to evaluate skin lesions changing on back; no other acute changes today;    Objective:  Vitals:   02/15/24 1057 02/15/24 1127  BP: (!) 140/76 112/72  Pulse: 76   Resp: 18   Temp: 98 F (36.7 C)   SpO2: 99%    Weight: 151 lb (68.5 kg)   Height: 5\' 3"  (1.6 m)     General: Well developed, well nourished, in no acute distress  Skin : Warm and dry. SK noted at bra- line; irregular mole noted beneath left scapula Head: Normocephalic and atraumatic  Eyes: Sclera and conjunctiva clear; pupils round and reactive to light; extraocular movements intact  Ears: External normal; canals clear; tympanic membranes normal  Oropharynx: Pink, supple. No suspicious lesions  Neck: Supple without thyromegaly, adenopathy  Lungs: Respirations unlabored; Extremities: No edema, cyanosis, clubbing  Vessels: Symmetric bilaterally  Neurologic: Alert and oriented; speech intact; face symmetrical; moves all extremities well; CNII-XII intact without focal deficit   Assessment:  1. Changing pigmented skin lesion     Plan:  Referral updated as requested; follow up as needed otherwise;   No follow-ups on file.  Orders Placed This Encounter  Procedures   Ambulatory referral to Dermatology    Referral Priority:   Routine    Referral Type:   Consultation    Referral Reason:   Specialty Services Required    Requested Specialty:   Dermatology    Number of Visits Requested:   1    Requested Prescriptions    No prescriptions requested or ordered in this encounter

## 2024-02-27 DIAGNOSIS — D225 Melanocytic nevi of trunk: Secondary | ICD-10-CM | POA: Diagnosis not present

## 2024-02-27 DIAGNOSIS — L821 Other seborrheic keratosis: Secondary | ICD-10-CM | POA: Diagnosis not present

## 2024-03-07 ENCOUNTER — Ambulatory Visit: Admitting: Family Medicine

## 2024-03-11 NOTE — Progress Notes (Unsigned)
 [facility] [address] [place] [address] Phone: [phone] Subjective:   IBryan Caprio, am serving as a scribe for Dr. Ronnell Coins.  I'm seeing this patient by the request  of:  Adra Alanis, FNP  CC: Hypermobility and pain  BJY:[id number]  Lindsey Fields is a 53 y.o. female coming in with complaint of hypermobility. L side is out of balance. Feels like there has been a knot in her gluteal for 5 years. Went to ED in Jan 2025. Wrapping hips helps when having a bad day. Issues after having kids.    Patient was previously seen by another provider and did have an MRI of the lumbar spine in August 2024.  This was independently visualized by me.  Showed some mild arthritic changes but no significant nerve impingement noted. Past Medical History:  Diagnosis Date   Allergy    Arthritis    GERD (gastroesophageal reflux disease)    Past Surgical History:  Procedure Laterality Date   BREAST BIOPSY Left    BREAST EXCISIONAL BIOPSY Left    COLONOSCOPY  10/20/2011   Dr. Briant Camper.   ESOPHAGOGASTRODUODENOSCOPY  01/31/2016   Dr. Briant Camper, [facility]: Normal esophagus, mild gastritis (path with mild reactive gastropathy, normal duodenum (path normal)   Social History   Socioeconomic History   Marital status: Married    Spouse name: Lyell Samuel   Number of children: 2   Years of education: Not on file   Highest education level: Master's degree (e.g., MA, MS, MEng, MEd, MSW, MBA)  Occupational History   Not on file  Tobacco Use   Smoking status: Never   Smokeless tobacco: Never  Vaping Use   Vaping status: Never Used  Substance and Sexual Activity   Alcohol use: Yes    Comment: weekly   Drug use: Not Currently   Sexual activity: Yes    Birth control/protection: None  Other Topics Concern   Not on file  Social History Narrative   Not on file   Social Drivers of Health   Financial Resource Strain: Low Risk   (12/14/2023)   Overall Financial Resource Strain (CARDIA)    Difficulty of Paying Living Expenses: Not very hard  Food Insecurity: No Food Insecurity (12/14/2023)   Hunger Vital Sign    Worried About Running Out of Food in the Last Year: Never true    Ran Out of Food in the Last Year: Never true  Transportation Needs: No Transportation Needs (12/14/2023)   PRAPARE - Administrator, Civil Service (Medical): No    Lack of Transportation (Non-Medical): No  Physical Activity: Unknown (12/14/2023)   Exercise Vital Sign    Days of Exercise per Week: 0 days    Minutes of Exercise per Session: Not on file  Stress: No Stress Concern Present (12/14/2023)   [employer] of Occupational Health - Occupational Stress Questionnaire    Feeling of Stress : Only a little  Social Connections: Socially Integrated (12/14/2023)   Social Connection and Isolation Panel [NHANES]    Frequency of Communication with Friends and Family: More than three times a week    Frequency of Social Gatherings with Friends and Family: Three times a week    Attends Religious Services: More than 4 times per year    Active Member of Clubs or Organizations: Yes    Attends Banker Meetings: More than 4 times per year    Marital Status: Married  Allergies  Allergen Reactions   Metaxalone Other (See Comments)    Altered taste in mouth   Family History  Problem Relation Age of Onset   Heart disease Mother    Colon polyps Father    Pancreatitis Father    Colon cancer Neg Hx    Esophageal cancer Neg Hx    Rectal cancer Neg Hx    Stomach cancer Neg Hx    Pancreatic cancer Neg Hx       Current Outpatient Medications (Respiratory):    fexofenadine (ALLEGRA) 180 MG tablet, Take 180 mg by mouth daily.  Current Outpatient Medications (Analgesics):    naproxen sodium (ALEVE) 220 MG tablet, Take 220 mg by mouth as needed.   Current Outpatient Medications (Other):    cyclobenzaprine  (FLEXERIL ) 5 MG  tablet, Take 1 tablet (5 mg total) by mouth at bedtime as needed for muscle spasms.   Probiotic Product (PROBIOTIC-10 PO), Take by mouth daily.   VITAMIN D, CHOLECALCIFEROL, PO, Take 1 tablet by mouth daily.   Reviewed prior external information including notes and imaging from  primary care provider As well as notes that were available from care everywhere and other healthcare systems.  Past medical history, social, surgical and family history all reviewed in electronic medical record.  No pertanent information unless stated regarding to the chief complaint.   Review of Systems:  No headache, visual changes, nausea, vomiting, diarrhea, constipation, dizziness, abdominal pain, skin rash, fevers, chills, night sweats, weight loss, swollen lymph nodes, body aches, joint swelling, chest pain, shortness of breath, mood changes. POSITIVE muscle aches  Objective  Blood pressure 122/84, pulse 74, height 5\' 3"  (1.6 m), weight 152 lb (68.9 kg), SpO2 96%.   General: No apparent distress alert and oriented x3 mood and affect normal, dressed appropriately.  HEENT: Pupils equal, extraocular movements intact  Respiratory: Patient's speak in full sentences and does not appear short of breath  Cardiovascular: No lower extremity edema, non tender, no erythema  Neck exam does have some tenderness to palpation over the sacroiliac joint.  Positive FABER test noted.  Does have some hypermobility also noted.  Seems to be significantly more discomfort on the left side of the body than the right side of the body.  Able to go from a seated to standing position without any significant difficulty though.    Impression and Recommendations:    The above documentation has been reviewed and is accurate and complete Lindsey Malachi M Kynzli Rease, DO

## 2024-03-12 ENCOUNTER — Ambulatory Visit: Admitting: Family Medicine

## 2024-03-12 ENCOUNTER — Ambulatory Visit (INDEPENDENT_AMBULATORY_CARE_PROVIDER_SITE_OTHER)

## 2024-03-12 VITALS — BP 122/84 | HR 74 | Ht 63.0 in | Wt 152.0 lb

## 2024-03-12 DIAGNOSIS — M533 Sacrococcygeal disorders, not elsewhere classified: Secondary | ICD-10-CM

## 2024-03-12 DIAGNOSIS — M2550 Pain in unspecified joint: Secondary | ICD-10-CM | POA: Diagnosis not present

## 2024-03-12 DIAGNOSIS — M357 Hypermobility syndrome: Secondary | ICD-10-CM | POA: Diagnosis not present

## 2024-03-12 DIAGNOSIS — M25552 Pain in left hip: Secondary | ICD-10-CM | POA: Diagnosis not present

## 2024-03-12 DIAGNOSIS — M160 Bilateral primary osteoarthritis of hip: Secondary | ICD-10-CM | POA: Diagnosis not present

## 2024-03-12 LAB — CBC WITH DIFFERENTIAL/PLATELET
Basophils Absolute: 0.1 10*3/uL (ref 0.0–0.1)
Basophils Relative: 0.7 % (ref 0.0–3.0)
Eosinophils Absolute: 0.1 10*3/uL (ref 0.0–0.7)
Eosinophils Relative: 1 % (ref 0.0–5.0)
HCT: 42.4 % (ref 36.0–46.0)
Hemoglobin: 13.9 g/dL (ref 12.0–15.0)
Lymphocytes Relative: 33.6 % (ref 12.0–46.0)
Lymphs Abs: 2.3 10*3/uL (ref 0.7–4.0)
MCHC: 32.8 g/dL (ref 30.0–36.0)
MCV: 93.4 fl (ref 78.0–100.0)
Monocytes Absolute: 0.5 10*3/uL (ref 0.1–1.0)
Monocytes Relative: 6.9 % (ref 3.0–12.0)
Neutro Abs: 4 10*3/uL (ref 1.4–7.7)
Neutrophils Relative %: 57.8 % (ref 43.0–77.0)
Platelets: 301 10*3/uL (ref 150.0–400.0)
RBC: 4.53 Mil/uL (ref 3.87–5.11)
RDW: 13.3 % (ref 11.5–15.5)
WBC: 6.9 10*3/uL (ref 4.0–10.5)

## 2024-03-12 LAB — URIC ACID: Uric Acid, Serum: 4.4 mg/dL (ref 2.4–7.0)

## 2024-03-12 LAB — COMPREHENSIVE METABOLIC PANEL WITH GFR
ALT: 25 U/L (ref 0–35)
AST: 21 U/L (ref 0–37)
Albumin: 4.3 g/dL (ref 3.5–5.2)
Alkaline Phosphatase: 108 U/L (ref 39–117)
BUN: 18 mg/dL (ref 6–23)
CO2: 26 meq/L (ref 19–32)
Calcium: 9.3 mg/dL (ref 8.4–10.5)
Chloride: 105 meq/L (ref 96–112)
Creatinine, Ser: 0.63 mg/dL (ref 0.40–1.20)
GFR: 101.4 mL/min (ref >60.00)
Glucose, Bld: 105 mg/dL — ABNORMAL HIGH (ref 70–99)
Potassium: 4 meq/L (ref 3.5–5.1)
Sodium: 139 meq/L (ref 135–145)
Total Bilirubin: 0.3 mg/dL (ref 0.2–1.2)
Total Protein: 7 g/dL (ref 6.0–8.3)

## 2024-03-12 LAB — VITAMIN D 25 HYDROXY (VIT D DEFICIENCY, FRACTURES): VITD: 32.04 ng/mL (ref 30.00–100.00)

## 2024-03-12 LAB — IBC PANEL
Iron: 76 ug/dL (ref 42–145)
Saturation Ratios: 18.8 % — ABNORMAL LOW (ref 20.0–50.0)
TIBC: 403.2 ug/dL (ref 250.0–450.0)
Transferrin: 288 mg/dL (ref 212.0–360.0)

## 2024-03-12 LAB — FERRITIN: Ferritin: 29.1 ng/mL (ref 10.0–291.0)

## 2024-03-12 LAB — TSH: TSH: 1.33 u[IU]/mL (ref 0.35–5.50)

## 2024-03-12 LAB — SEDIMENTATION RATE: Sed Rate: 12 mm/h (ref 0–30)

## 2024-03-12 LAB — VITAMIN B12: Vitamin B-12: 230 pg/mL (ref 211–911)

## 2024-03-12 LAB — C-REACTIVE PROTEIN: CRP: <1 mg/dL (ref 0.5–20.0)

## 2024-03-12 NOTE — Assessment & Plan Note (Signed)
 Discussed HEP discussed with patient about aquatic therapy with patient having difficulty previously.  Has had sacroiliac joint dysfunction and feels like there is tightness in the area.  Has failed all other conservative aspects including injections in the SI joint, the greater trochanteric area.  MRI of the lumbar spine does not show any significant bony abnormality.  Would like to get laboratory workup to make sure nothing else is contributing at the moment.  Discussed home exercises and icing regimen.  Follow-up again in 6 to 8 weeks otherwise.

## 2024-03-12 NOTE — Patient Instructions (Addendum)
 [facility] [phone] Call Today  When we receive your results we will contact you.  Xrays today Labs today PT referral See you again in 2 months

## 2024-03-13 ENCOUNTER — Encounter: Payer: Self-pay | Admitting: Family Medicine

## 2024-03-15 LAB — ANTI-NUCLEAR AB-TITER (ANA TITER): ANA Titer 1: 1:40 {titer} — ABNORMAL HIGH

## 2024-03-15 LAB — CYCLIC CITRUL PEPTIDE ANTIBODY, IGG: Cyclic Citrullin Peptide Ab: <16 U

## 2024-03-15 LAB — ANA: Anti Nuclear Antibody (ANA): POSITIVE — AB

## 2024-03-15 LAB — ANGIOTENSIN CONVERTING ENZYME: Angiotensin-Converting Enzyme: 78 U/L — ABNORMAL HIGH (ref 9–67)

## 2024-03-15 LAB — PTH, INTACT AND CALCIUM
Calcium: 9.6 mg/dL (ref 8.6–10.4)
PTH: 26 pg/mL (ref 16–77)

## 2024-03-15 LAB — RHEUMATOID FACTOR: Rheumatoid fact SerPl-aCnc: <10 [IU]/mL (ref <14)

## 2024-03-15 LAB — CALCIUM, IONIZED: Calcium, Ion: 5.2 mg/dL (ref 4.7–5.5)

## 2024-03-17 ENCOUNTER — Encounter: Payer: Self-pay | Admitting: Family Medicine

## 2024-03-18 ENCOUNTER — Ambulatory Visit: Payer: Self-pay | Admitting: Family Medicine

## 2024-03-27 ENCOUNTER — Other Ambulatory Visit

## 2024-03-28 ENCOUNTER — Ambulatory Visit
Admission: RE | Admit: 2024-03-28 | Discharge: 2024-03-28 | Disposition: A | Discharge status: Home / Self Care | Source: Ambulatory Visit | Attending: Family Medicine | Admitting: Family Medicine

## 2024-03-28 DIAGNOSIS — M461 Sacroiliitis, not elsewhere classified: Secondary | ICD-10-CM | POA: Diagnosis not present

## 2024-03-28 DIAGNOSIS — M533 Sacrococcygeal disorders, not elsewhere classified: Secondary | ICD-10-CM

## 2024-03-28 MED ORDER — GADOPICLENOL 0.5 MMOL/ML IV SOLN
6.0000 mL | Freq: Once | INTRAVENOUS | Status: AC | PRN
Start: 2024-03-28 — End: 2024-03-28
  Administered 2024-03-28: 6 mL via INTRAVENOUS

## 2024-04-16 ENCOUNTER — Telehealth (HOSPITAL_BASED_OUTPATIENT_CLINIC_OR_DEPARTMENT_OTHER): Payer: Self-pay | Admitting: Physical Therapy

## 2024-04-16 NOTE — Telephone Encounter (Signed)
 Left message in regards to rescheduling her physical therapy evaluation on 6/20 as provider will not be available.  She will return call tomorrow for reschedule.

## 2024-04-17 ENCOUNTER — Encounter (HOSPITAL_BASED_OUTPATIENT_CLINIC_OR_DEPARTMENT_OTHER): Payer: Self-pay | Admitting: Physical Therapy

## 2024-04-25 ENCOUNTER — Ambulatory Visit (HOSPITAL_BASED_OUTPATIENT_CLINIC_OR_DEPARTMENT_OTHER): Admitting: Physical Therapy

## 2024-05-07 ENCOUNTER — Other Ambulatory Visit: Payer: Self-pay

## 2024-05-07 ENCOUNTER — Encounter (HOSPITAL_BASED_OUTPATIENT_CLINIC_OR_DEPARTMENT_OTHER): Payer: Self-pay | Admitting: Physical Therapy

## 2024-05-07 ENCOUNTER — Ambulatory Visit (HOSPITAL_BASED_OUTPATIENT_CLINIC_OR_DEPARTMENT_OTHER): Attending: Family Medicine | Admitting: Physical Therapy

## 2024-05-07 DIAGNOSIS — M25551 Pain in right hip: Secondary | ICD-10-CM | POA: Insufficient documentation

## 2024-05-07 DIAGNOSIS — M6281 Muscle weakness (generalized): Secondary | ICD-10-CM | POA: Diagnosis not present

## 2024-05-07 DIAGNOSIS — M533 Sacrococcygeal disorders, not elsewhere classified: Secondary | ICD-10-CM | POA: Insufficient documentation

## 2024-05-07 DIAGNOSIS — M25552 Pain in left hip: Secondary | ICD-10-CM | POA: Insufficient documentation

## 2024-05-07 DIAGNOSIS — M5459 Other low back pain: Secondary | ICD-10-CM | POA: Insufficient documentation

## 2024-05-07 NOTE — Therapy (Deleted)
 OUTPATIENT PHYSICAL THERAPY CERVICAL EVALUATION   Patient Name: Lindsey Fields MRN: [id number] DOB:December 29, 1970, 53 y.o., female Today's Date: 05/07/2024  END OF SESSION:   Past Medical History:  Diagnosis Date   Allergy    Arthritis    GERD (gastroesophageal reflux disease)    Past Surgical History:  Procedure Laterality Date   BREAST BIOPSY Left    BREAST EXCISIONAL BIOPSY Left    COLONOSCOPY  10/20/2011   Dr. Vaughn Hertz.   ESOPHAGOGASTRODUODENOSCOPY  01/31/2016   Dr. 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  Rationale for Evaluation and Treatment: {HABREHAB:27488}  ONSET DATE: ***  SUBJECTIVE:                                                                                                                                                                                                         SUBJECTIVE STATEMENT: *** Hand dominance: {MISC; OT HAND DOMINANCE:[phone]}  PERTINENT HISTORY:  ***  PAIN:  Are you having pain? {OPRCPAIN:27236}  PRECAUTIONS: {Therapy precautions:24002}  RED FLAGS: {PT Red Flags:29287}     WEIGHT BEARING RESTRICTIONS: {Yes ***/No:24003}  FALLS:  Has patient fallen in last 6 months? {fallsyesno:27318}  LIVING ENVIRONMENT: Lives with: {OPRC lives with:25569::lives with their family} Lives in: {Lives in:25570} Stairs: {opstairs:27293} Has following equipment at home: {Assistive devices:23999}  OCCUPATION: ***  PLOF: {PLOF:24004}  PATIENT GOALS: ***  NEXT MD VISIT: ***  OBJECTIVE:  Note: Objective measures were completed at  Evaluation unless otherwise noted.  DIAGNOSTIC FINDINGS:  MRI pelvis  IMPRESSION: 1. No evidence of sacroiliitis or other significant arthropathic changes. 2. Mild gluteus and common hamstring tendinosis, right greater than left. 3. Mild disc desiccation at L5-S1  5/25 x-ray pelvis IMPRESSION: 1. Mild bilateral hip osteoarthritis. 2. Moderate pubic symphysis osteoarthritis.  PATIENT SURVEYS:  {rehab surveys:24030}  COGNITION: Overall cognitive status: {cognition:24006}  SENSATION: {sensation:27233}  POSTURE: {posture:25561}  PALPATION: ***   CERVICAL ROM:   {AROM/PROM:27142} ROM A/PROM (deg) eval  Flexion   Extension   Right lateral flexion   Left lateral flexion   Right rotation   Left rotation    (Blank rows = not tested)  UPPER EXTREMITY ROM:  {AROM/PROM:27142} ROM Right eval Left  eval  Shoulder flexion    Shoulder extension    Shoulder abduction    Shoulder adduction    Shoulder extension    Shoulder internal rotation    Shoulder external rotation    Elbow flexion    Elbow extension    Wrist flexion    Wrist extension    Wrist ulnar deviation    Wrist radial deviation    Wrist pronation    Wrist supination     (Blank rows = not tested)  UPPER EXTREMITY MMT:  MMT Right eval Left eval  Shoulder flexion    Shoulder extension    Shoulder abduction    Shoulder adduction    Shoulder extension    Shoulder internal rotation    Shoulder external rotation    Middle trapezius    Lower trapezius    Elbow flexion    Elbow extension    Wrist flexion    Wrist extension    Wrist ulnar deviation    Wrist radial deviation    Wrist pronation    Wrist supination    Grip strength     (Blank rows = not tested)  CERVICAL SPECIAL TESTS:  {Cervical special tests:25246}  FUNCTIONAL TESTS:  {Functional tests:24029}  TREATMENT DATE: ***                                                                                                                                  PATIENT EDUCATION:  Education details: *** Person educated: {Person educated:25204} Education method: {Education Method:25205} Education comprehension: {Education Comprehension:25206}  HOME EXERCISE PROGRAM: ***  ASSESSMENT:  CLINICAL IMPRESSION: Patient is a *** y.o. *** who was seen today for physical therapy evaluation and treatment for ***.   OBJECTIVE IMPAIRMENTS: {opptimpairments:25111}.   ACTIVITY LIMITATIONS: {activitylimitations:27494}  PARTICIPATION LIMITATIONS: {participationrestrictions:25113}  PERSONAL FACTORS: {Personal factors:25162} are also affecting patient's functional outcome.   REHAB POTENTIAL: {rehabpotential:25112}  CLINICAL DECISION MAKING: {clinical decision making:25114}  EVALUATION COMPLEXITY: {Evaluation complexity:25115}   GOALS: Goals reviewed with patient? {yes/no:20286}  SHORT TERM GOALS: Target date: ***  *** Baseline:  Goal status: INITIAL  2.  *** Baseline:  Goal status: INITIAL  3.  *** Baseline:  Goal status: INITIAL  4.  *** Baseline:  Goal status: INITIAL  5.  *** Baseline:  Goal status: INITIAL  6.  *** Baseline:  Goal status: INITIAL  LONG TERM GOALS: Target date: ***  *** Baseline:  Goal status: INITIAL  2.  *** Baseline:  Goal status: INITIAL  3.  *** Baseline:  Goal status: INITIAL  4.  *** Baseline:  Goal status: INITIAL  5.  *** Baseline:  Goal status: INITIAL  6.  *** Baseline:  Goal status: INITIAL   PLAN:  PT FREQUENCY: {rehab frequency:25116}  PT DURATION: {rehab duration:25117}  PLANNED INTERVENTIONS: {rehab planned interventions:25118::97110-Therapeutic exercises,97530- Therapeutic [phone]- Neuromuscular re-education,97535- Self Rjmz,02859- Manual therapy}  PLAN FOR NEXT SESSION: PIERRETTE Matilda Kohut, PT 05/07/2024, 7:12  AM

## 2024-05-07 NOTE — Progress Notes (Deleted)
 [facility] [address] [place] [address] Phone: [phone] Subjective:    I'm seeing this patient by the request  of:  Jason Leita Repine, FNP  CC: Low back pain with hypermobility follow-up  YEP:Dlagzrupcz  03/12/2024 Discussed HEP discussed with patient about aquatic therapy with patient having difficulty previously.  Has had sacroiliac joint dysfunction and feels like there is tightness in the area.  Has failed all other conservative aspects including injections in the SI joint, the greater trochanteric area.  MRI of the lumbar spine does not show any significant bony abnormality.  Would like to get laboratory workup to make sure nothing else is contributing at the moment.  Discussed home exercises and icing regimen.  Follow-up again in 6 to 8 weeks otherwise.     Updated 05/12/2024 Lindsey Fields is a 53 y.o. female coming in with complaint of hypermobility, found to have more sacroiliac joint dysfunction on the left side.  Started with a workup with laboratory found to have a positive ANA but a low titer, found to have an elevation in the angiotensin-converting enzyme, sedimentation rate that was normal  MRI of the pelvis was done May 23.  No sign of any sacroiliitis but did have some hamstring tendinosis and gluteal tendinosis.  Mild degenerative disc disease at L5-S1.    Past Medical History:  Diagnosis Date   Allergy    Arthritis    GERD (gastroesophageal reflux disease)    Past Surgical History:  Procedure Laterality Date   BREAST BIOPSY Left    BREAST EXCISIONAL BIOPSY Left    COLONOSCOPY  10/20/2011   Dr. Vaughn Hertz.   ESOPHAGOGASTRODUODENOSCOPY  01/31/2016   Dr. Vaughn Hertz, [facility]: Normal esophagus, mild gastritis (path with mild reactive gastropathy, normal duodenum (path normal)   Social History   Socioeconomic History   Marital status: Married    Spouse name: [name]   Number of children: 2   Years of  education: Not on file   Highest education level: Master's degree (e.g., MA, MS, MEng, MEd, MSW, MBA)  Occupational History   Not on file  Tobacco Use   Smoking status: Never   Smokeless tobacco: Never  Vaping Use   Vaping status: Never Used  Substance and Sexual Activity   Alcohol use: Yes    Comment: weekly   Drug use: Not Currently   Sexual activity: Yes    Birth control/protection: None  Other Topics Concern   Not on file  Social History Narrative   Not on file   Social Drivers of Health   Financial Resource Strain: Low Risk  (12/14/2023)   Overall Financial Resource Strain (CARDIA)    Difficulty of Paying Living Expenses: Not very hard  Food Insecurity: No Food Insecurity (12/14/2023)   Hunger Vital Sign    Worried About Running Out of Food in the Last Year: Never true    Ran Out of Food in the Last Year: Never true  Transportation Needs: No Transportation Needs (12/14/2023)   PRAPARE - Administrator, Civil Service (Medical): No    Lack of Transportation (Non-Medical): No  Physical Activity: Unknown (12/14/2023)   Exercise Vital Sign    Days of Exercise per Week: 0 days    Minutes of Exercise per Session: Not on file  Stress: No Stress Concern Present (12/14/2023)   [employer] of Occupational Health - Occupational Stress Questionnaire    Feeling of Stress : Only a little  Social  Connections: Socially Integrated (12/14/2023)   Social Connection and Isolation Panel    Frequency of Communication with Friends and Family: More than three times a week    Frequency of Social Gatherings with Friends and Family: Three times a week    Attends Religious Services: More than 4 times per year    Active Member of Clubs or Organizations: Yes    Attends Engineer, structural: More than 4 times per year    Marital Status: Married   Allergies  Allergen Reactions   Metaxalone Other (See Comments)    Altered taste in mouth   Family History  Problem Relation Age  of Onset   Heart disease Mother    Colon polyps Father    Pancreatitis Father    Colon cancer Neg Hx    Esophageal cancer Neg Hx    Rectal cancer Neg Hx    Stomach cancer Neg Hx    Pancreatic cancer Neg Hx       Current Outpatient Medications (Respiratory):    fexofenadine (ALLEGRA) 180 MG tablet, Take 180 mg by mouth daily.  Current Outpatient Medications (Analgesics):    naproxen sodium (ALEVE) 220 MG tablet, Take 220 mg by mouth as needed.   Current Outpatient Medications (Other):    cyclobenzaprine  (FLEXERIL ) 5 MG tablet, Take 1 tablet (5 mg total) by mouth at bedtime as needed for muscle spasms.   Probiotic Product (PROBIOTIC-10 PO), Take by mouth daily.   VITAMIN D , CHOLECALCIFEROL, PO, Take 1 tablet by mouth daily.   Reviewed prior external information including notes and imaging from  primary care provider As well as notes that were available from care everywhere and other healthcare systems.  Past medical history, social, surgical and family history all reviewed in electronic medical record.  No pertanent information unless stated regarding to the chief complaint.   Review of Systems:  No headache, visual changes, nausea, vomiting, diarrhea, constipation, dizziness, abdominal pain, skin rash, fevers, chills, night sweats, weight loss, swollen lymph nodes, body aches, joint swelling, chest pain, shortness of breath, mood changes. POSITIVE muscle aches  Objective  There were no vitals taken for this visit.   General: No apparent distress alert and oriented x3 mood and affect normal, dressed appropriately.  HEENT: Pupils equal, extraocular movements intact  Respiratory: Patient's speak in full sentences and does not appear short of breath  Cardiovascular: No lower extremity edema, non tender, no erythema  Low back exam shows    Impression and Recommendations:     The above documentation has been reviewed and is accurate and complete Quavon Keisling M Josanna Hefel, DO

## 2024-05-07 NOTE — Therapy (Signed)
 OUTPATIENT PHYSICAL THERAPY THORACOLUMBAR EVALUATION   Patient Name: Lindsey Fields MRN: [id number] DOB:11/20/1970, 53 y.o., female Today's Date: 05/07/2024  END OF SESSION:  PT End of Session - 05/07/24 0917     Visit Number 1    Date for PT Re-Evaluation 07/04/24    Authorization Type BCBS    PT Start Time [phone]    PT Stop Time 0800    PT Time Calculation (min) 42 min    Activity Tolerance Patient tolerated treatment well    Behavior During Therapy [facility] for tasks assessed/performed          Past Medical History:  Diagnosis Date   Allergy    Arthritis    GERD (gastroesophageal reflux disease)    Past Surgical History:  Procedure Laterality Date   BREAST BIOPSY Left    BREAST EXCISIONAL BIOPSY Left    COLONOSCOPY  10/20/2011   Dr. Vaughn Hertz.   ESOPHAGOGASTRODUODENOSCOPY  01/31/2016   Dr. Vaughn Hertz, [facility]: Normal esophagus, mild gastritis (path with mild reactive gastropathy, normal duodenum (path normal)   Patient Active Problem List   Diagnosis Date Noted   Sacroiliac joint dysfunction of left side 09/21/2021   Hypermobility syndrome 09/21/2021    PCP: Leita MICAEL Elbe FNP  REFERRING PROVIDER: M53.3 (ICD-10-CM) - Sacroiliac joint dysfunction of left side   REFERRING DIAG: Claudene Arthea HERO, DO   Rationale for Evaluation and Treatment: Rehabilitation  THERAPY DIAG:  Other low back pain  Bilateral hip pain  Muscle weakness (generalized)  ONSET DATE: increasing frequency of flares in last few years  SUBJECTIVE:                                                                                                                                                                                           SUBJECTIVE STATEMENT: Pain in lb SIJ comes and goes for many years has gotten worse.  Started 20 yrs ago after I had my datr.  Walking >30 minutes, pulling my knees to my chest, kneeling/squatting, picking up anything heavy (lifting 40lb dog, case  of waters).  Can increase pain next day.  If flare It can be for an entire week and am limited with all activities. Will use ice packs, have taken muscle relaxers in past.  I feel that my whole left side can get knotted up.  Last flare 1 month ago lasting just a few days. I stay really mindful watching my movements to avoid.  PERTINENT HISTORY:  SIJ pain  PAIN:  Are you having pain? No  no pain right now.  Just feel tight in left hip leg up into shoulder  PRECAUTIONS: None  RED FLAGS: None   WEIGHT BEARING RESTRICTIONS: No  FALLS:  Has patient fallen in last 6 months? 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  DIAGNOSTIC FINDINGS:  MRI 5/25 IMPRESSION: 1. No evidence of sacroiliitis or other significant arthropathic changes. 2. Mild gluteus and common hamstring tendinosis, right greater than left. 3. Mild disc desiccation at L5-S1.  X-ray 5/25 IMPRESSION: 1. Mild bilateral hip osteoarthritis. 2. Moderate pubic symphysis osteoarthritis.  PATIENT SURVEYS:  Modified Oswestry:  MODIFIED OSWESTRY DISABILITY SCALE  Date: 05/07/24 Score  Pain intensity 0 = I can tolerate the pain I have without having to use pain medication.  2. Personal care (washing, dressing, etc.) 0 =  I can take care of myself normally without causing increased pain.  3. Lifting 3 = Pain prevents me from lifting heavy weights, but I can manage (5) I have hardly any social life because of my pain. light to medium weights if they are conveniently positioned  4. Walking 2 =  Pain prevents me from walking more than  mile.  5. Sitting 0 =   I can sit in any chair as long as I like.  6. Standing 1 =  I can stand as long as I want but, it increases my pain.  7. Sleeping 0 = Pain does not prevent me from sleeping well.  8. Social Life 0 = My social life is normal and does not increase my pain.  9. Traveling 0 =  I can travel anywhere without increased pain.  10. Employment/ Homemaking 1 = My normal homemaking/job activities increase my pain, but I can still perform all that is required of me  Total 7/50   Interpretation of scores: Score Category Description  0-20% Minimal Disability The patient can cope with most living activities. Usually no treatment is indicated apart from advice on lifting, sitting and exercise  21-40% Moderate Disability The patient experiences more pain and difficulty with sitting, lifting and standing. Travel and social life are more difficult and they may be disabled from work. Personal care, sexual activity and sleeping are not grossly affected, and the patient can usually be managed by conservative means  41-60% Severe Disability Pain remains the main problem in this group, but activities of daily living are affected. These patients require a detailed investigation  61-80% Crippled Back pain impinges on all aspects of the patient's life. Positive intervention is required  81-100% Bed-bound  These patients are either bed-bound or exaggerating their symptoms  Bluford FORBES Zoe DELENA Karon DELENA, et al. Surgery versus conservative management of stable thoracolumbar fracture: the PRESTO feasibility RCT. [place] ([place]): [employer]; 2021 Nov. [facility] Technology Assessment, No. 25.62.) Appendix 3, Oswestry Disability Index category descriptors. Available from: FindJewelers.cz  Minimally Clinically Important Difference (MCID) = 12.8%  COGNITION: Overall cognitive status: Within functional limits for tasks assessed     SENSATION: WFL  MUSCLE LENGTH: Hamstrings:  wfl   POSTURE: forward head  PALPATION: TTP right and left trochanters bilateral glutes L>R; left SI area  LUMBAR ROM:   wfl  LOWER EXTREMITY ROM:     wfl  LOWER  EXTREMITY MMT:    MMT Right eval Left eval  Hip flexion 33.8 33.0  Hip extension    Hip abduction 24.9 30.0  Hip adduction    Hip internal rotation    Hip external rotation    Knee flexion    Knee extension 35.1 35.0  Ankle dorsiflexion    Ankle plantarflexion    Ankle inversion    Ankle eversion     (Blank rows = not tested)  LUMBAR SPECIAL TESTS:  Straight leg raise test: neg; Single leg stance test: Negative, SI Compression/distraction test: Negative, FABER test: Positive, and Thomas test: Positive left  FUNCTIONAL TESTS:     4 stage balance:passed 1,2&3. SLS left x 4s; right x20 GAIT: Distance walked:  Assistive device utilized: None Level of assistance: Complete Independence Comments: slight right trendelenburg   TREATMENT  Eval Self care:Posture and Optometrist handout and instruction                                                                                                                                PATIENT EDUCATION:  Education details: Discussed eval findings, rehab rationale, aquatic program progression/POC and pools in area. Patient is in agreement  Person educated: Patient Education method: Explanation Education comprehension: verbalized understanding  HOME EXERCISE PROGRAM: TBA  ASSESSMENT:  CLINICAL IMPRESSION: Patient is a 53 y.o. f who was seen today for physical therapy evaluation and treatment for SIJ dysfunction left side.  Pt presents without pain today/not flared. Reports intermittent flares which involve left SIJ area pain that can last several days to 1-2 weeks.  Flares occur when pt walks up to 30 minutes, lifts heavy objects (40 lb dog or case of waters) or if she brings her knees to her chest while squatting or pulling knees to chest in supine.  She  reports she is constantly mindful of her movements to avoid flare.  Has been going on for several years with increasing frequency.  Diagnositics do not support SIJ dysfunction.  Md indicates maybe hypermobility in L SIJ.  I am suspicious of gluteus and common hamstring tendinosis findings in MRI (taken when she was  not in a flare) as to being a contributing factor. She does appear to have a slight R trendelenburg sign.  Plan to see her in aquatics to instruct HEP for strengthening of area and as a pain management tool when flared.  Will also be seen by land therapist for further assessment and treatment.  OBJECTIVE IMPAIRMENTS: Abnormal gait, difficulty walking, decreased strength, and intermittent pain.   ACTIVITY LIMITATIONS: lifting, bending, stairs, and locomotion level  PARTICIPATION LIMITATIONS: walking and daily exercise for overall wellbeing and health   REHAB POTENTIAL: Good  CLINICAL DECISION MAKING: Stable/uncomplicated  EVALUATION COMPLEXITY: Moderate   GOALS: Goals reviewed with patient? Yes  SHORT TERM GOALS: Target date: 06/04/24  Pt to instructed and indep in aquatic exercise program for strengthening and pain management using the properties of  water Baseline: Goal status: INITIAL    LONG TERM GOALS: Target date: 07/04/24  Pt to improve on ODI by 10% to demonstrate statistically significant Improvement in function. Baseline: 7/50=14% Goal status: INITIAL  2.  Pt will report amb toleration without limitation to pain x 1 hour Baseline: 30 min Goal status: INITIAL  3.  Pt will be without pain flare throughout episode Baseline:  Goal status: INITIAL  4.  Gait without right trendelenburg Baseline:  Goal status: INITIAL  5.  Pt will be indep with final HEP's (land and aquatic as appropriate) for continued management of condition Baseline:  Goal status: INITIAL    PLAN:  PT FREQUENCY: 1-2x/week  PT DURATION: 8 weeks  PLANNED INTERVENTIONS: 97164- PT  Re-evaluation, 97750- Physical Performance Testing, 97110-Therapeutic exercises, 97530- Therapeutic activity, 97112- Neuromuscular re-education, 97535- Self Care, 02859- Manual therapy, [id number]- Gait training, [phone]- Orthotic Initial, [phone]- Aquatic Therapy, [phone]- Electrical stimulation (unattended), [phone]- Ionotophoresis 4mg /ml Dexamethasone, 20560 (1-2 muscles), 20561 (3+ muscles)- Dry Needling, Patient/Family education, Balance training, Stair training, Taping, Joint mobilization, DME instructions, Cryotherapy, and Moist heat.  PLAN FOR NEXT SESSION: Aquatic: general core/hip/glute strengthening and stretching; instruction on positioning for pain management: HEP max 4 visits Land: re-assess left SIJ vs hamstring/glute tendinosis L>R.  Glute and core strengthening, posture re-alignment; HEP    Ronal Foots) Adriaan Maltese MPT 05/07/24 9:20 AM [facility] [address] [place], [place], [address] Phone: [phone]   Fax:  [phone]

## 2024-05-12 ENCOUNTER — Ambulatory Visit: Admitting: Family Medicine

## 2024-06-10 DIAGNOSIS — L820 Inflamed seborrheic keratosis: Secondary | ICD-10-CM | POA: Diagnosis not present

## 2024-06-17 ENCOUNTER — Encounter (HOSPITAL_BASED_OUTPATIENT_CLINIC_OR_DEPARTMENT_OTHER): Payer: Self-pay | Admitting: Physical Therapy

## 2024-06-17 ENCOUNTER — Ambulatory Visit (HOSPITAL_BASED_OUTPATIENT_CLINIC_OR_DEPARTMENT_OTHER): Payer: Self-pay | Attending: Family Medicine | Admitting: Physical Therapy

## 2024-06-17 DIAGNOSIS — M6281 Muscle weakness (generalized): Secondary | ICD-10-CM | POA: Insufficient documentation

## 2024-06-17 DIAGNOSIS — M5459 Other low back pain: Secondary | ICD-10-CM | POA: Insufficient documentation

## 2024-06-17 DIAGNOSIS — M25551 Pain in right hip: Secondary | ICD-10-CM | POA: Diagnosis not present

## 2024-06-17 DIAGNOSIS — M25552 Pain in left hip: Secondary | ICD-10-CM | POA: Diagnosis not present

## 2024-06-17 NOTE — Therapy (Signed)
 OUTPATIENT PHYSICAL THERAPY THORACOLUMBAR Treatment   Patient Name: Lindsey Fields MRN: [id number] DOB:Mar 14, 1971, 53 y.o., female Today's Date: 06/17/2024  END OF SESSION:  PT End of Session - 06/17/24 1347     Visit Number 2    Date for PT Re-Evaluation 07/04/24    Authorization Type BCBS    PT Start Time 1347    PT Stop Time 1426    PT Time Calculation (min) 39 min    Activity Tolerance Patient tolerated treatment well    Behavior During Therapy [facility] for tasks assessed/performed           Past Medical History:  Diagnosis Date   Allergy    Arthritis    GERD (gastroesophageal reflux disease)    Past Surgical History:  Procedure Laterality Date   BREAST BIOPSY Left    BREAST EXCISIONAL BIOPSY Left    COLONOSCOPY  10/20/2011   Dr. Vaughn Hertz.   ESOPHAGOGASTRODUODENOSCOPY  01/31/2016   Dr. Vaughn Hertz, [facility]: Normal esophagus, mild gastritis (path with mild reactive gastropathy, normal duodenum (path normal)   Patient Active Problem List   Diagnosis Date Noted   Sacroiliac joint dysfunction of left side 09/21/2021   Hypermobility syndrome 09/21/2021    PCP: Lindsey Fields  REFERRING PROVIDER: M53.3 (ICD-10-CM) - Sacroiliac joint dysfunction of left side   REFERRING DIAG: Lindsey Arthea HERO, Lindsey Fields   Rationale for Evaluation and Treatment: Rehabilitation  THERAPY DIAG:  Other low back pain  Bilateral hip pain  Muscle weakness (generalized)  ONSET DATE: increasing frequency of flares in last few years  SUBJECTIVE:                                                                                                                                                                                           SUBJECTIVE STATEMENT: A flare up feels like the Lt buttock grabs- it all started on the right but moved to the left. I feel like my left side is all tight.   EVAL: Pain in lb SIJ comes and goes for many years has gotten worse.  Started 20 yrs  ago after I had my datr.  Walking >30 minutes, pulling my knees to my chest, kneeling/squatting, picking up anything heavy (lifting 40lb dog, case of waters).  Can increase pain next day.  If flare It can be for an entire week and am limited with all activities. Will use ice packs, have taken muscle relaxers in past.  I feel that my whole left side can get knotted up.  Last flare 1 month ago lasting just a few days. I stay really mindful watching my  movements to avoid.  PERTINENT HISTORY:  SIJ pain  PAIN:  Are you having pain? No  no pain right now.  Just feel tight in left hip leg up into shoulder  PRECAUTIONS: None  RED FLAGS: None   WEIGHT BEARING RESTRICTIONS: No  FALLS:  Has patient fallen in last 6 months? 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  DIAGNOSTIC FINDINGS:  MRI 5/25 IMPRESSION: 1. No evidence of sacroiliitis or other significant arthropathic changes. 2. Mild gluteus and common hamstring tendinosis, right greater than left. 3. Mild disc desiccation at L5-S1.  X-ray 5/25 IMPRESSION: 1. Mild bilateral hip osteoarthritis. 2. Moderate pubic symphysis osteoarthritis.  PATIENT SURVEYS:  Modified Oswestry:  MODIFIED OSWESTRY DISABILITY SCALE  Date: 05/07/24 Score  Pain intensity 0 = I can tolerate the pain I have without having to use pain medication.  2. Personal care (washing, dressing, etc.) 0 =  I can take care of myself normally without causing increased pain.  3. Lifting 3 = Pain prevents me from lifting heavy weights, but I can manage (5) I have hardly any social life because of my  pain. light to medium weights if they are conveniently positioned  4. Walking 2 =  Pain prevents me from walking more than  mile.  5. Sitting 0 =  I can sit in any chair as long as I like.  6. Standing 1 =  I can stand as long as I want but, it increases my pain.  7. Sleeping 0 = Pain does not prevent me from sleeping well.  8. Social Life 0 = My social life is normal and does not increase my pain.  9. Traveling 0 =  I can travel anywhere without increased pain.  10. Employment/ Homemaking 1 = My normal homemaking/job activities increase my pain, but I can still perform all that is required of me  Total 7/50   Interpretation of scores: Score Category Description  0-20% Minimal Disability The patient can cope with most living activities. Usually no treatment is indicated apart from advice on lifting, sitting and exercise  21-40% Moderate Disability The patient experiences more pain and difficulty with sitting, lifting and standing. Travel and social life are more difficult and they may be disabled from work. Personal care, sexual activity and sleeping are not grossly affected, and the patient can usually be managed by conservative means  41-60% Severe Disability Pain remains the main problem in this group, but activities of daily living are affected. These patients require a detailed investigation  61-80% Crippled Back pain impinges on all aspects of the patient's life. Positive intervention is required  81-100% Bed-bound  These patients are either bed-bound or exaggerating their symptoms  Bluford FORBES Zoe DELENA Karon DELENA, et al. Surgery versus conservative management of stable thoracolumbar fracture: the PRESTO feasibility RCT. [place] ([place]): [employer]; 2021 Nov. [facility] Technology Assessment, No. 25.62.) Appendix 3, Oswestry Disability Index category descriptors. Available from: FindJewelers.cz  Minimally Clinically Important Difference (MCID) =  12.8%  COGNITION: Overall cognitive status: Within functional limits for tasks assessed     SENSATION: WFL  MUSCLE LENGTH: Hamstrings: wfl  POSTURE: forward head  8/12: standing: Lt shoulder & scapular elevation. Increased height of Lt ribs under Lt scapula, Rt ASIS elevation  Supine: Lt leg shorter, level ASIS  PALPATION: TTP right and left trochanters bilateral glutes L>R; left SI area  LUMBAR ROM:   wfl  LOWER EXTREMITY ROM:     wfl  LOWER EXTREMITY MMT:    MMT Right eval Left eval  Hip flexion 33.8 33.0  Hip extension    Hip abduction 24.9 30.0  Hip adduction    Hip internal rotation    Hip external rotation    Knee flexion    Knee extension 35.1 35.0  Ankle dorsiflexion    Ankle plantarflexion    Ankle inversion    Ankle eversion     (Blank rows = not tested)  LUMBAR SPECIAL TESTS:  Straight leg raise test: neg; Single leg stance test: Negative, SI Compression/distraction test: Negative, FABER test: Positive, and Thomas test: Positive left  FUNCTIONAL TESTS:     4 stage balance:passed 1,2&3. SLS left x 4s; right x20 GAIT: Distance walked:  Assistive device utilized: None Level of assistance: Complete Independence Comments: slight right trendelenburg   TREATMENT  Treatment                            8/12: Blank lines following charge title = not provided on this treatment date.   Manual:  TPDN No  There-ex: Hip hike- standing on Rt Standing squat pull off bar Standing bent over hip abd from turnout There-Act:  Self Care: Added 3-layer heel lift to LEFT shoe Nuro-Re-ed: Hooklying ball squeeze + ab set + bridge Bridge + UE horiz abd Gait Training:      PATIENT EDUCATION:  Education details: Discussed eval findings, rehab rationale, aquatic program progression/POC and pools in area. Patient is in agreement  Person educated: Patient Education method: Explanation Education comprehension: verbalized understanding  HOME EXERCISE  PROGRAM: Access Code: [email or web address] URL: [email or web address] Date: 06/17/2024 Prepared by: Lindsey Fields  Exercises - Supine Hip Adduction Isometric with Mercer  - 1 x daily - 7 x weekly - 1 sets - 10 reps - 3s hold - Supine Bridge with Mini Swiss Ball Between Knees  - 1 x daily - 7 x weekly - 3 sets - 10 reps - Supine Shoulder Horizontal Abduction with Resistance  - 1 x daily - 7 x weekly - 3 sets - 10 reps - Hip Hiking on Step  - 1 x daily - 7 x weekly - 3 sets - 10 reps - Squat with Counter Support  - 1 x daily - 7 x weekly - 3 sets - 10 reps - Sit to Stand with Mercer Between Knees  - 1 x daily - 7 x weekly - 3 sets - 10 reps 3-layer heel lift in LEFT shoe (wear for 48 hrs after 8/12 appt)  ASSESSMENT:  CLINICAL IMPRESSION:  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 Will benefit from continued PT for central stability with life- pt will message me on Friday to let me know how the lift has felt. Instructed to remove if increase in concordant pain.   EVAL: Patient is a 53 y.o. f who was seen today for physical therapy evaluation and treatment for SIJ dysfunction left side.  Pt presents without pain today/not flared. Reports intermittent flares which involve left SIJ area pain that can last several days to 1-2 weeks.  Flares occur  when pt walks up to 30 minutes, lifts heavy objects (40 lb dog or case of waters) or if she brings her knees to her chest while squatting or pulling knees to chest in supine.  She reports she is constantly mindful of her movements to avoid flare.  Has been going on for several years with increasing frequency.  Diagnositics Lindsey Fields not support SIJ dysfunction.  Md indicates maybe hypermobility in L SIJ.  I am suspicious of gluteus and common hamstring tendinosis findings in MRI (taken when she was  not in a flare) as to being a contributing factor. She does appear to have a slight R trendelenburg sign.  Plan to  see her in aquatics to instruct HEP for strengthening of area and as a pain management tool when flared.  Will also be seen by land therapist for further assessment and treatment.  OBJECTIVE IMPAIRMENTS: Abnormal gait, difficulty walking, decreased strength, and intermittent pain.   ACTIVITY LIMITATIONS: lifting, bending, stairs, and locomotion level  PARTICIPATION LIMITATIONS: walking and daily exercise for overall wellbeing and health   REHAB POTENTIAL: Good  CLINICAL DECISION MAKING: Stable/uncomplicated  EVALUATION COMPLEXITY: Moderate   GOALS: Goals reviewed with patient? Yes  SHORT TERM GOALS: Target date: 06/04/24  Pt to instructed and indep in aquatic exercise program for strengthening and pain management using the properties of water Baseline: Goal status: INITIAL    LONG TERM GOALS: Target date: 07/04/24  Pt to improve on ODI by 10% to demonstrate statistically significant Improvement in function. Baseline: 7/50=14% Goal status: INITIAL  2.  Pt will report amb toleration without limitation to pain x 1 hour Baseline: 30 min Goal status: INITIAL  3.  Pt will be without pain flare throughout episode Baseline:  Goal status: INITIAL  4.  Gait without right trendelenburg Baseline:  Goal status: INITIAL  5.  Pt will be indep with final HEP's (land and aquatic as appropriate) for continued management of condition Baseline:  Goal status: INITIAL    PLAN:  PT FREQUENCY: 1-2x/week  PT DURATION: 8 weeks  PLANNED INTERVENTIONS: 97164- PT Re-evaluation, 97750- Physical Performance Testing, 97110-Therapeutic exercises, 97530- Therapeutic activity, 97112- Neuromuscular re-education, 97535- Self Care, 02859- Manual therapy, [id number]- Gait training, [phone]- Orthotic Initial, [phone]- Aquatic Therapy, [phone]- Electrical stimulation (unattended), [phone]- Ionotophoresis 4mg /ml Dexamethasone, 79439 (1-2 muscles), 20561 (3+ muscles)- Dry Needling, Patient/Family education, Balance  training, Stair training, Taping, Joint mobilization, DME instructions, Cryotherapy, and Moist heat.  PLAN FOR NEXT SESSION: Aquatic: general core/hip/glute strengthening and stretching; instruction on positioning for pain management: HEP max 4 visits Land: re-assess left SIJ vs hamstring/glute tendinosis L>R.  Glute and core strengthening, posture re-alignment; HEP    Zlatan Hornback C. Aliza Moret PT, DPT 06/17/24 2:29 PM  [facility] [address] [place], [place], [address] Phone: [phone]   Fax:  [phone]

## 2024-06-21 ENCOUNTER — Encounter (HOSPITAL_BASED_OUTPATIENT_CLINIC_OR_DEPARTMENT_OTHER): Payer: Self-pay | Admitting: Physical Therapy

## 2024-07-02 NOTE — Progress Notes (Unsigned)
 [facility] [address] [place] [address] Phone: [phone] Subjective:   Lindsey Fields, am serving as a scribe for Dr. Arthea Claudene.  I'm seeing this patient by the request  of:  Jason Leita Repine, FNP  CC: Hypermobility follow-up  YEP:Dlagzrupcz  03/12/2024 Discussed HEP discussed with patient about aquatic therapy with patient having difficulty previously.  Has had sacroiliac joint dysfunction and feels like there is tightness in the area.  Has failed all other conservative aspects including injections in the SI joint, the greater trochanteric area.  MRI of the lumbar spine does not show any significant bony abnormality.  Would like to get laboratory workup to make sure nothing else is contributing at the moment.  Discussed home exercises and icing regimen.  Follow-up again in 6 to 8 weeks otherwise.      Update 07/03/2024 Lindsey Fields is a 53 y.o. female coming in with complaint of hypermobility. Patient states has been doing okay. Just got into PT.  Everything is about the same.   Laboratory workup did show a low titer positive ANA  Did have elevation of the angiotensin-converting enzyme.  This was also just minorly.  Ferritin of 29, low B12 at 230.  MRI of the pelvis did show more of mild gluteal and hamstring tendinosis.  Past Medical History:  Diagnosis Date   Allergy    Arthritis    GERD (gastroesophageal reflux disease)    Past Surgical History:  Procedure Laterality Date   BREAST BIOPSY Left    BREAST EXCISIONAL BIOPSY Left    COLONOSCOPY  10/20/2011   Dr. Vaughn Hertz.   ESOPHAGOGASTRODUODENOSCOPY  01/31/2016   Dr. Vaughn Hertz, [facility]: Normal esophagus, mild gastritis (path with mild reactive gastropathy, normal duodenum (path normal)   Social History   Socioeconomic History   Marital status: Married    Spouse name: Lindsey Fields   Number of children: 2   Years of education: Not on file   Highest  education level: Master's degree (e.g., MA, MS, MEng, MEd, MSW, MBA)  Occupational History   Not on file  Tobacco Use   Smoking status: Never   Smokeless tobacco: Never  Vaping Use   Vaping status: Never Used  Substance and Sexual Activity   Alcohol use: Yes    Comment: weekly   Drug use: Not Currently   Sexual activity: Yes    Birth control/protection: None  Other Topics Concern   Not on file  Social History Narrative   Not on file   Social Drivers of Health   Financial Resource Strain: Low Risk  (12/14/2023)   Overall Financial Resource Strain (CARDIA)    Difficulty of Paying Living Expenses: Not very hard  Food Insecurity: No Food Insecurity (12/14/2023)   Hunger Vital Sign    Worried About Running Out of Food in the Last Year: Never true    Ran Out of Food in the Last Year: Never true  Transportation Needs: No Transportation Needs (12/14/2023)   PRAPARE - Administrator, Civil Service (Medical): No    Lack of Transportation (Non-Medical): No  Physical Activity: Unknown (12/14/2023)   Exercise Vital Sign    Days of Exercise per Week: 0 days    Minutes of Exercise per Session: Not on file  Stress: No Stress Concern Present (12/14/2023)   [employer] of Occupational Health - Occupational Stress Questionnaire    Feeling of Stress : Only a little  Social Connections: Socially Integrated (  12/14/2023)   Social Connection and Isolation Panel    Frequency of Communication with Friends and Family: More than three times a week    Frequency of Social Gatherings with Friends and Family: Three times a week    Attends Religious Services: More than 4 times per year    Active Member of Clubs or Organizations: Yes    Attends Engineer, structural: More than 4 times per year    Marital Status: Married   Allergies  Allergen Reactions   Metaxalone Other (See Comments)    Altered taste in mouth   Family History  Problem Relation Age of Onset   Heart disease Mother     Colon polyps Father    Pancreatitis Father    Colon cancer Neg Hx    Esophageal cancer Neg Hx    Rectal cancer Neg Hx    Stomach cancer Neg Hx    Pancreatic cancer Neg Hx       Current Outpatient Medications (Respiratory):    fexofenadine (ALLEGRA) 180 MG tablet, Take 180 mg by mouth daily.  Current Outpatient Medications (Analgesics):    naproxen sodium (ALEVE) 220 MG tablet, Take 220 mg by mouth as needed.   Current Outpatient Medications (Other):    cyclobenzaprine  (FLEXERIL ) 5 MG tablet, Take 1 tablet (5 mg total) by mouth at bedtime as needed for muscle spasms.   Probiotic Product (PROBIOTIC-10 PO), Take by mouth daily.   VITAMIN D , CHOLECALCIFEROL, PO, Take 1 tablet by mouth daily.   Reviewed prior external information including notes and imaging from  primary care provider As well as notes that were available from care everywhere and other healthcare systems.  Past medical history, social, surgical and family history all reviewed in electronic medical record.  No pertanent information unless stated regarding to the chief complaint.   Review of Systems:  No headache, visual changes, nausea, vomiting, diarrhea, constipation, dizziness, abdominal pain, skin rash, fevers, chills, night sweats, weight loss, swollen lymph nodes, body aches, joint swelling, chest pain, shortness of breath, mood changes. POSITIVE muscle aches  Objective  Blood pressure 112/80, pulse 68, height 5' 3 (1.6 m), weight 154 lb (69.9 kg), SpO2 98%.   General: No apparent distress alert and oriented x3 mood and affect normal, dressed appropriately.  HEENT: Pupils equal, extraocular movements intact  Respiratory: Patient's speak in full sentences and does not appear short of breath  Cardiovascular: No lower extremity edema, non tender, no erythema  Sitting comfortably at this time.  Still tender to palpation over the gluteal area.  Still tender over the sacroiliac joint.  Did not do any type of  strength testing noted.    Impression and Recommendations:    The above documentation has been reviewed and is accurate and complete Xee Hollman M Georgeanne Frankland, DO

## 2024-07-03 ENCOUNTER — Ambulatory Visit (INDEPENDENT_AMBULATORY_CARE_PROVIDER_SITE_OTHER): Admitting: Family Medicine

## 2024-07-03 VITALS — BP 112/80 | HR 68 | Ht 63.0 in | Wt 154.0 lb

## 2024-07-03 DIAGNOSIS — M533 Sacrococcygeal disorders, not elsewhere classified: Secondary | ICD-10-CM

## 2024-07-03 NOTE — Patient Instructions (Signed)
 Vit C 1000 daily Continue other vitamins See you again in 2 months consider labs Continue heel lifts Love what PT is doing

## 2024-07-03 NOTE — Assessment & Plan Note (Signed)
 Patient has not done physical therapy yet continuously.  I would like to see how patient does with this before we change other management at the moment.  Discussed with patient about icing regimen and home exercises, which activities to do and which ones to avoid.  Increase activity slowly.  Follow-up again in 6 to 8 weeks otherwise.  No change in medications.

## 2024-07-15 ENCOUNTER — Encounter (HOSPITAL_BASED_OUTPATIENT_CLINIC_OR_DEPARTMENT_OTHER): Payer: Self-pay | Admitting: Physical Therapy

## 2024-07-16 ENCOUNTER — Ambulatory Visit (HOSPITAL_BASED_OUTPATIENT_CLINIC_OR_DEPARTMENT_OTHER): Admitting: Physical Therapy

## 2024-07-23 NOTE — Therapy (Signed)
 OUTPATIENT PHYSICAL THERAPY THORACOLUMBAR Treatment   Patient Name: Lindsey Fields MRN: [id number] DOB:1971-03-23, 53 y.o., female Today's Date: 07/26/2024  END OF SESSION:  PT End of Session - 07/26/24 1004     Visit Number 3    Date for Recertification  07/04/24    Authorization Type BCBS    PT Start Time 1004    PT Stop Time 1043    PT Time Calculation (min) 39 min    Activity Tolerance Patient tolerated treatment well    Behavior During Therapy WFL for tasks assessed/performed            Past Medical History:  Diagnosis Date   Allergy    Arthritis    GERD (gastroesophageal reflux disease)    Past Surgical History:  Procedure Laterality Date   BREAST BIOPSY Left    BREAST EXCISIONAL BIOPSY Left    COLONOSCOPY  10/20/2011   Dr. Vaughn Hertz.   ESOPHAGOGASTRODUODENOSCOPY  01/31/2016   Dr. Vaughn Hertz, [facility]: Normal esophagus, mild gastritis (path with mild reactive gastropathy, normal duodenum (path normal)   Patient Active Problem List   Diagnosis Date Noted   Sacroiliac joint dysfunction of left side 09/21/2021   Hypermobility syndrome 09/21/2021    PCP: Leita MICAEL Elbe FNP  REFERRING PROVIDER: M53.3 (ICD-10-CM) - Sacroiliac joint dysfunction of left side   REFERRING DIAG: Claudene Arthea HERO, DO   Rationale for Evaluation and Treatment: Rehabilitation  THERAPY DIAG:  Other low back pain  Bilateral hip pain  Muscle weakness (generalized)  ONSET DATE: increasing frequency of flares in last few years  SUBJECTIVE:                                                                                                                                                                                           SUBJECTIVE STATEMENT: A flare up feels like the Lt buttock grabs- it all started on the right but moved to the left. I feel like my left side is all tight.   EVAL: Pain in lb SIJ comes and goes for many years has gotten worse.  Started 20 yrs  ago after I had my datr.  Walking >30 minutes, pulling my knees to my chest, kneeling/squatting, picking up anything heavy (lifting 40lb dog, case of waters).  Can increase pain next day.  If flare It can be for an entire week and am limited with all activities. Will use ice packs, have taken muscle relaxers in past.  I feel that my whole left side can get knotted up.  Last flare 1 month ago lasting just a few days. I stay really mindful watching  my movements to avoid.  PERTINENT HISTORY:  SIJ pain  PAIN:  Are you having pain? No  no pain right now.  Just feel tight in left hip leg up into shoulder  PRECAUTIONS: None  RED FLAGS: None   WEIGHT BEARING RESTRICTIONS: No  FALLS:  Has patient fallen in last 6 months? 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  DIAGNOSTIC FINDINGS:  MRI 5/25 IMPRESSION: 1. No evidence of sacroiliitis or other significant arthropathic changes. 2. Mild gluteus and common hamstring tendinosis, right greater than left. 3. Mild disc desiccation at L5-S1.  X-ray 5/25 IMPRESSION: 1. Mild bilateral hip osteoarthritis. 2. Moderate pubic symphysis osteoarthritis.  PATIENT SURVEYS:  Modified Oswestry:  MODIFIED OSWESTRY DISABILITY SCALE  Date: 05/07/24 Score  Pain intensity 0 = I can tolerate the pain I have without having to use pain medication.  2. Personal care (washing, dressing, etc.) 0 =  I can take care of myself normally without causing increased pain.  3. Lifting 3 = Pain prevents me from lifting heavy weights, but I can manage (5) I have hardly any social life because of my  pain. light to medium weights if they are conveniently positioned  4. Walking 2 =  Pain prevents me from walking more than  mile.  5. Sitting 0 =  I can sit in any chair as long as I like.  6. Standing 1 =  I can stand as long as I want but, it increases my pain.  7. Sleeping 0 = Pain does not prevent me from sleeping well.  8. Social Life 0 = My social life is normal and does not increase my pain.  9. Traveling 0 =  I can travel anywhere without increased pain.  10. Employment/ Homemaking 1 = My normal homemaking/job activities increase my pain, but I can still perform all that is required of me  Total 7/50   Interpretation of scores: Score Category Description  0-20% Minimal Disability The patient can cope with most living activities. Usually no treatment is indicated apart from advice on lifting, sitting and exercise  21-40% Moderate Disability The patient experiences more pain and difficulty with sitting, lifting and standing. Travel and social life are more difficult and they may be disabled from work. Personal care, sexual activity and sleeping are not grossly affected, and the patient can usually be managed by conservative means  41-60% Severe Disability Pain remains the main problem in this group, but activities of daily living are affected. These patients require a detailed investigation  61-80% Crippled Back pain impinges on all aspects of the patient's life. Positive intervention is required  81-100% Bed-bound  These patients are either bed-bound or exaggerating their symptoms  Bluford FORBES Zoe DELENA Karon DELENA, et al. Surgery versus conservative management of stable thoracolumbar fracture: the PRESTO feasibility RCT. [place] ([place]): [employer]; 2021 Nov. [facility] Technology Assessment, No. 25.62.) Appendix 3, Oswestry Disability Index category descriptors. Available from: FindJewelers.cz  Minimally Clinically Important Difference (MCID) =  12.8%  COGNITION: Overall cognitive status: Within functional limits for tasks assessed     SENSATION: WFL  MUSCLE LENGTH: Hamstrings: wfl  POSTURE: forward head  8/12: standing: Lt shoulder & scapular elevation. Increased height of Lt ribs under Lt scapula, Rt ASIS elevation  Supine: Lt leg shorter, level ASIS  PALPATION: TTP right and left trochanters bilateral glutes L>R; left SI area  LUMBAR ROM:   wfl  LOWER EXTREMITY ROM:     wfl  LOWER EXTREMITY MMT:    MMT Right eval Left eval  Hip flexion 33.8 33.0  Hip extension    Hip abduction 24.9 30.0  Hip adduction    Hip internal rotation    Hip external rotation    Knee flexion    Knee extension 35.1 35.0  Ankle dorsiflexion    Ankle plantarflexion    Ankle inversion    Ankle eversion     (Blank rows = not tested)  LUMBAR SPECIAL TESTS:  Straight leg raise test: neg; Single leg stance test: Negative, SI Compression/distraction test: Negative, FABER test: Positive, and Thomas test: Positive left  FUNCTIONAL TESTS:     4 stage balance:passed 1,2&3. SLS left x 4s; right x20 GAIT: Distance walked:  Assistive device utilized: None Level of assistance: Complete Independence Comments: slight right trendelenburg   TREATMENT Treatment                            8/20: Blank lines following charge title = not provided on this treatment date.   Manual:  TPDN No  There-ex: IR fall in's in supine Clams  Hip hike- standing on Rt Standing squat pull off bar Leg press with 50lbs on shuttle.  There-Act:  Self Care:  Nuro-Re-ed: Hooklying ball squeeze + ab set + bridge Bridge + UE horiz abd Ball squeezes supine Gait Training: Treatment                            8/12: Blank lines following charge title = not provided on this treatment date.   Manual:  TPDN No  There-ex: Hip hike- standing on Rt Standing squat pull off bar Standing bent over hip abd from turnout There-Act:  Self Care: Added  3-layer heel lift to LEFT shoe Nuro-Re-ed: Hooklying ball squeeze + ab set + bridge Bridge + UE horiz abd Gait Training:      PATIENT EDUCATION:  Education details: Discussed eval findings, rehab rationale, aquatic program progression/POC and pools in area. Patient is in agreement  Person educated: Patient Education method: Explanation Education comprehension: verbalized understanding  HOME EXERCISE PROGRAM: Access Code: [email or web address] URL: [email or web address] Date: 06/17/2024 Prepared by: Harlene Cordon  Exercises - Supine Hip Adduction Isometric with Ball  - 1 x daily - 7 x weekly - 1 sets - 10 reps - 3s hold - Supine Bridge with Mini Swiss Ball Between Knees  - 1 x daily - 7 x weekly - 3 sets - 10 reps - Supine Shoulder Horizontal Abduction with Resistance  - 1 x daily - 7 x weekly - 3 sets - 10 reps - Hip Hiking on Step  - 1 x daily - 7 x weekly - 3 sets - 10 reps - Squat with Counter Support  - 1 x daily - 7 x weekly - 3 sets - 10 reps - Sit to Stand with Mercer Between Knees  - 1 x daily - 7 x weekly - 3 sets - 10 reps 3-layer heel lift in LEFT shoe (wear for 48 hrs after 8/12 appt)  ASSESSMENT:  CLINICAL IMPRESSION: Pt  denies compliance with her HEP. She reports some pain with squats. Assessment with noted shift, tactile cues with improvements noted. No complaints with heel lift this far. Pt fatigues very quickly with exercises. She tolerated session well. Pt will continue to benefit from skilled PT to address continued deficits.    EVAL: Patient is a 54 y.o. f who was seen today for physical therapy evaluation and treatment for SIJ dysfunction left side.  Pt presents without pain today/not flared. Reports intermittent flares which involve left SIJ area pain that can last several days to 1-2 weeks.  Flares occur when pt walks up to 30 minutes, lifts heavy objects (40 lb dog or case of waters) or if she brings her knees to her chest while squatting or pulling  knees to chest in supine.  She reports she is constantly mindful of her movements to avoid flare.  Has been going on for several years with increasing frequency.  Diagnositics do not support SIJ dysfunction.  Md indicates maybe hypermobility in L SIJ.  I am suspicious of gluteus and common hamstring tendinosis findings in MRI (taken when she was  not in a flare) as to being a contributing factor. She does appear to have a slight R trendelenburg sign.  Plan to see her in aquatics to instruct HEP for strengthening of area and as a pain management tool when flared.  Will also be seen by land therapist for further assessment and treatment.  OBJECTIVE IMPAIRMENTS: Abnormal gait, difficulty walking, decreased strength, and intermittent pain.   ACTIVITY LIMITATIONS: lifting, bending, stairs, and locomotion level  PARTICIPATION LIMITATIONS: walking and daily exercise for overall wellbeing and health   REHAB POTENTIAL: Good  CLINICAL DECISION MAKING: Stable/uncomplicated  EVALUATION COMPLEXITY: Moderate   GOALS: Goals reviewed with patient? Yes  SHORT TERM GOALS: Target date: 06/04/24  Pt to instructed and indep in aquatic exercise program for strengthening and pain management using the properties of water Baseline: Goal status: INITIAL    LONG TERM GOALS: Target date: 07/04/24  Pt to improve on ODI by 10% to demonstrate statistically significant Improvement in function. Baseline: 7/50=14% Goal status: INITIAL  2.  Pt will report amb toleration without limitation to pain x 1 hour Baseline: 30 min Goal status: INITIAL  3.  Pt will be without pain flare throughout episode Baseline:  Goal status: INITIAL  4.  Gait without right trendelenburg Baseline:  Goal status: INITIAL  5.  Pt will be indep with final HEP's (land and aquatic as appropriate) for continued management of condition Baseline:  Goal status: INITIAL    PLAN:  PT FREQUENCY: 1-2x/week  PT DURATION: 8  weeks  PLANNED INTERVENTIONS: 97164- PT Re-evaluation, 97750- Physical Performance Testing, 97110-Therapeutic exercises, 97530- Therapeutic activity, 97112- Neuromuscular re-education, 97535- Self Care, [id number]- Manual therapy, [id number]- Gait training, [phone]- Orthotic Initial, [phone]- Aquatic Therapy, [phone]- Electrical stimulation (unattended), [phone]- Ionotophoresis 4mg /ml Dexamethasone, 79439 (1-2 muscles), [id number] (3+ muscles)- Dry Needling, Patient/Family education, Balance training, Stair training, Taping, Joint mobilization, DME instructions, Cryotherapy, and Moist heat.  PLAN FOR NEXT SESSION: Aquatic: general core/hip/glute strengthening and stretching; instruction on positioning for pain management: HEP max 4 visits Land: re-assess left SIJ vs hamstring/glute tendinosis L>R.  Glute and core strengthening, posture re-alignment; HEP   Rojean Batten PT, DPT 07/26/24  11:29 AM   [facility] [address] [place], [place], [address] Phone: [phone]   Fax:  [phone]

## 2024-07-26 ENCOUNTER — Ambulatory Visit (HOSPITAL_BASED_OUTPATIENT_CLINIC_OR_DEPARTMENT_OTHER): Attending: Family Medicine | Admitting: Physical Therapy

## 2024-07-26 DIAGNOSIS — M5459 Other low back pain: Secondary | ICD-10-CM | POA: Insufficient documentation

## 2024-07-26 DIAGNOSIS — M25552 Pain in left hip: Secondary | ICD-10-CM | POA: Diagnosis not present

## 2024-07-26 DIAGNOSIS — M25551 Pain in right hip: Secondary | ICD-10-CM | POA: Insufficient documentation

## 2024-07-26 DIAGNOSIS — M6281 Muscle weakness (generalized): Secondary | ICD-10-CM | POA: Insufficient documentation

## 2024-07-30 ENCOUNTER — Encounter (HOSPITAL_BASED_OUTPATIENT_CLINIC_OR_DEPARTMENT_OTHER): Admitting: Physical Therapy

## 2024-07-30 ENCOUNTER — Ambulatory Visit (HOSPITAL_BASED_OUTPATIENT_CLINIC_OR_DEPARTMENT_OTHER): Admitting: Physical Therapy

## 2024-07-30 DIAGNOSIS — M6281 Muscle weakness (generalized): Secondary | ICD-10-CM

## 2024-07-30 DIAGNOSIS — M25552 Pain in left hip: Secondary | ICD-10-CM

## 2024-07-30 DIAGNOSIS — M5459 Other low back pain: Secondary | ICD-10-CM

## 2024-07-30 DIAGNOSIS — M25551 Pain in right hip: Secondary | ICD-10-CM | POA: Diagnosis not present

## 2024-07-30 NOTE — Therapy (Signed)
 OUTPATIENT PHYSICAL THERAPY THORACOLUMBAR Treatment   Patient Name: Lindsey Fields MRN: [id number] DOB:29-Dec-1970, 53 y.o., female Today's Date: 07/30/2024  END OF SESSION:  PT End of Session - 07/30/24 1222     Visit Number 4    Date for Recertification  07/04/24    Authorization Type BCBS    PT Start Time 1145    PT Stop Time 1223    PT Time Calculation (min) 38 min    Activity Tolerance Patient tolerated treatment well    Behavior During Therapy [facility] for tasks assessed/performed             Past Medical History:  Diagnosis Date   Allergy    Arthritis    GERD (gastroesophageal reflux disease)    Past Surgical History:  Procedure Laterality Date   BREAST BIOPSY Left    BREAST EXCISIONAL BIOPSY Left    COLONOSCOPY  10/20/2011   Dr. Vaughn Hertz.   ESOPHAGOGASTRODUODENOSCOPY  01/31/2016   Dr. Vaughn Hertz, [facility]: Normal esophagus, mild gastritis (path with mild reactive gastropathy, normal duodenum (path normal)   Patient Active Problem List   Diagnosis Date Noted   Sacroiliac joint dysfunction of left side 09/21/2021   Hypermobility syndrome 09/21/2021    PCP: Leita MICAEL Elbe FNP  REFERRING PROVIDER: M53.3 (ICD-10-CM) - Sacroiliac joint dysfunction of left side   REFERRING DIAG: Claudene Arthea HERO, DO   Rationale for Evaluation and Treatment: Rehabilitation  THERAPY DIAG:  Other low back pain  Bilateral hip pain  Muscle weakness (generalized)  ONSET DATE: increasing frequency of flares in last few years  SUBJECTIVE:                                                                                                                                                                                           SUBJECTIVE STATEMENT: I feel like I'm making improvements. I am looser. The the hip fall ins really help.   EVAL: Pain in lb SIJ comes and goes for many years has gotten worse.  Started 20 yrs ago after I had my datr.  Walking >30 minutes,  pulling my knees to my chest, kneeling/squatting, picking up anything heavy (lifting 40lb dog, case of waters).  Can increase pain next day.  If flare It can be for an entire week and am limited with all activities. Will use ice packs, have taken muscle relaxers in past.  I feel that my whole left side can get knotted up.  Last flare 1 month ago lasting just a few days. I stay really mindful watching my movements to avoid.  PERTINENT HISTORY:  SIJ pain  PAIN:  Are you having pain? No  no pain right now.  Just feel tight in left hip leg up into shoulder  PRECAUTIONS: None  RED FLAGS: None   WEIGHT BEARING RESTRICTIONS: No  FALLS:  Has patient fallen in last 6 months? 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  DIAGNOSTIC FINDINGS:  MRI 5/25 IMPRESSION: 1. No evidence of sacroiliitis or other significant arthropathic changes. 2. Mild gluteus and common hamstring tendinosis, right greater than left. 3. Mild disc desiccation at L5-S1.  X-ray 5/25 IMPRESSION: 1. Mild bilateral hip osteoarthritis. 2. Moderate pubic symphysis osteoarthritis.  PATIENT SURVEYS:  Modified Oswestry:  MODIFIED OSWESTRY DISABILITY SCALE  Date: 05/07/24 Score  Pain intensity 0 = I can tolerate the pain I have without having to use pain medication.  2. Personal care (washing, dressing, etc.) 0 =  I can take care of myself normally without causing increased pain.  3. Lifting 3 = Pain prevents me from lifting heavy weights, but I can manage (5) I have hardly any social life because of my pain. light to medium weights if they are  conveniently positioned  4. Walking 2 =  Pain prevents me from walking more than  mile.  5. Sitting 0 =  I can sit in any chair as long as I like.  6. Standing 1 =  I can stand as long as I want but, it increases my pain.  7. Sleeping 0 = Pain does not prevent me from sleeping well.  8. Social Life 0 = My social life is normal and does not increase my pain.  9. Traveling 0 =  I can travel anywhere without increased pain.  10. Employment/ Homemaking 1 = My normal homemaking/job activities increase my pain, but I can still perform all that is required of me  Total 7/50   Interpretation of scores: Score Category Description  0-20% Minimal Disability The patient can cope with most living activities. Usually no treatment is indicated apart from advice on lifting, sitting and exercise  21-40% Moderate Disability The patient experiences more pain and difficulty with sitting, lifting and standing. Travel and social life are more difficult and they may be disabled from work. Personal care, sexual activity and sleeping are not grossly affected, and the patient can usually be managed by conservative means  41-60% Severe Disability Pain remains the main problem in this group, but activities of daily living are affected. These patients require a detailed investigation  61-80% Crippled Back pain impinges on all aspects of the patient's life. Positive intervention is required  81-100% Bed-bound  These patients are either bed-bound or exaggerating their symptoms  Bluford FORBES Zoe DELENA Karon DELENA, et al. Surgery versus conservative management of stable thoracolumbar fracture: the PRESTO feasibility RCT. [place] ([place]): [employer]; 2021 Nov. [facility] Technology Assessment, No. 25.62.) Appendix 3, Oswestry Disability Index category descriptors. Available from: FindJewel[employer].cz  Minimally Clinically Important Difference (MCID) = 12.8%  COGNITION: Overall cognitive status:  Within functional limits for tasks assessed     SENSATION: WFL  MUSCLE LENGTH: Hamstrings: wfl   POSTURE: forward head  8/12: standing: Lt shoulder & scapular elevation.  Increased height of Lt ribs under Lt scapula, Rt ASIS elevation  Supine: Lt leg shorter, level ASIS  PALPATION: TTP right and left trochanters bilateral glutes L>R; left SI area  LUMBAR ROM:   wfl  LOWER EXTREMITY ROM:     wfl  LOWER EXTREMITY MMT:    MMT Right eval Left eval  Hip flexion 33.8 33.0  Hip extension    Hip abduction 24.9 30.0  Hip adduction    Hip internal rotation    Hip external rotation    Knee flexion    Knee extension 35.1 35.0  Ankle dorsiflexion    Ankle plantarflexion    Ankle inversion    Ankle eversion     (Blank rows = not tested)  LUMBAR SPECIAL TESTS:  Straight leg raise test: neg; Single leg stance test: Negative, SI Compression/distraction test: Negative, FABER test: Positive, and Thomas test: Positive left  FUNCTIONAL TESTS:     4 stage balance:passed 1,2&3. SLS left x 4s; right x20 GAIT: Distance walked:  Assistive device utilized: None Level of assistance: Complete Independence Comments: slight right trendelenburg   TREATMENT Treatment                            9/24: Blank lines following charge title = not provided on this treatment date.   Manual:  TPDN No  There-ex: Nustep lvl 5, 5 min IR fall in's in supine Hip hike- standing on Rt Standing hip abduction/ Extension Standing march with 3 second hold at top.  Side stepping at counter  There-Act:  Self Care:  Nuro-Re-ed: Hooklying ball squeeze + ab set + bridge Bridge with GTB for increased glute activation  Ball squeezes supine Clams with GTB for increased glute activation PNF D2 supine with LE in 90/90    Gait Training: Blank lines following charge title = not provided on this treatment date.   Manual:  TPDN No  There-ex: IR fall in's in supine Clams  Hip hike- standing on  Rt Standing squat pull off bar Leg press with 50lbs on shuttle.  There-Act:  Self Care:  Nuro-Re-ed: Hooklying ball squeeze + ab set + bridge Bridge + UE horiz abd Ball squeezes supine Gait Training: Treatment                            8/12: Blank lines following charge title = not provided on this treatment date.   Manual:  TPDN No  There-ex: Hip hike- standing on Rt Standing squat pull off bar Standing bent over hip abd from turnout There-Act:  Self Care: Added 3-layer heel lift to LEFT shoe Nuro-Re-ed: Hooklying ball squeeze + ab set + bridge Bridge + UE horiz abd Gait Training:      PATIENT EDUCATION:  Education details: Discussed eval findings, rehab rationale, aquatic program progression/POC and pools in area. Patient is in agreement  Person educated: Patient Education method: Explanation Education comprehension: verbalized understanding  HOME EXERCISE PROGRAM: Access Code: [email or web address] URL: [email or web address] Date: 06/17/2024 Prepared by: Harlene Cordon  Exercises - Supine Hip Adduction Isometric with Ball  - 1 x daily - 7 x weekly - 1 sets - 10 reps - 3s hold - Supine Bridge with Mini Swiss Ball Between Knees  - 1 x daily - 7 x weekly - 3 sets - 10 reps - Supine Shoulder Horizontal Abduction with Resistance  - 1 x daily - 7 x weekly - 3  sets - 10 reps - Hip Hiking on Step  - 1 x daily - 7 x weekly - 3 sets - 10 reps - Squat with Counter Support  - 1 x daily - 7 x weekly - 3 sets - 10 reps - Sit to Stand with Mercer Between Knees  - 1 x daily - 7 x weekly - 3 sets - 10 reps 3-layer heel lift in LEFT shoe (wear for 48 hrs after 8/12 appt)  ASSESSMENT:  CLINICAL IMPRESSION: Pt tolerates session well today. Significant improvements in tolerance to activity with no pain noted, but fatigue. Pt is unable to perform SL March with alt leg pushing ball into wall to challenge balance and glute med. Discussed weakness and trendelenburg. Pt will  continue to benefit from skilled PT to address continued deficits.    EVAL: Patient is a 53 y.o. f who was seen today for physical therapy evaluation and treatment for SIJ dysfunction left side.  Pt presents without pain today/not flared. Reports intermittent flares which involve left SIJ area pain that can last several days to 1-2 weeks.  Flares occur when pt walks up to 30 minutes, lifts heavy objects (40 lb dog or case of waters) or if she brings her knees to her chest while squatting or pulling knees to chest in supine.  She reports she is constantly mindful of her movements to avoid flare.  Has been going on for several years with increasing frequency.  Diagnositics do not support SIJ dysfunction.  Md indicates maybe hypermobility in L SIJ.  I am suspicious of gluteus and common hamstring tendinosis findings in MRI (taken when she was  not in a flare) as to being a contributing factor. She does appear to have a slight R trendelenburg sign.  Plan to see her in aquatics to instruct HEP for strengthening of area and as a pain management tool when flared.  Will also be seen by land therapist for further assessment and treatment.  OBJECTIVE IMPAIRMENTS: Abnormal gait, difficulty walking, decreased strength, and intermittent pain.   ACTIVITY LIMITATIONS: lifting, bending, stairs, and locomotion level  PARTICIPATION LIMITATIONS: walking and daily exercise for overall wellbeing and health   REHAB POTENTIAL: Good  CLINICAL DECISION MAKING: Stable/uncomplicated  EVALUATION COMPLEXITY: Moderate   GOALS: Goals reviewed with patient? Yes  SHORT TERM GOALS: Target date: 06/04/24  Pt to instructed and indep in aquatic exercise program for strengthening and pain management using the properties of water Baseline: Goal status: INITIAL    LONG TERM GOALS: Target date: 07/04/24  Pt to improve on ODI by 10% to demonstrate statistically significant Improvement in function. Baseline: 7/50=14% Goal  status: INITIAL  2.  Pt will report amb toleration without limitation to pain x 1 hour Baseline: 30 min Goal status: INITIAL  3.  Pt will be without pain flare throughout episode Baseline:  Goal status: INITIAL  4.  Gait without right trendelenburg Baseline:  Goal status: INITIAL  5.  Pt will be indep with final HEP's (land and aquatic as appropriate) for continued management of condition Baseline:  Goal status: INITIAL    PLAN:  PT FREQUENCY: 1-2x/week  PT DURATION: 8 weeks  PLANNED INTERVENTIONS: 97164- PT Re-evaluation, 97750- Physical Performance Testing, 97110-Therapeutic exercises, 97530- Therapeutic activity, 97112- Neuromuscular re-education, 97535- Self Care, [id number]- Manual therapy, [id number]- Gait training, [phone]- Orthotic Initial, [phone]- Aquatic Therapy, [phone]- Electrical stimulation (unattended), [phone]- Ionotophoresis 4mg /ml Dexamethasone, 79439 (1-2 muscles), [id number] (3+ muscles)- Dry Needling, Patient/Family education, Balance training, Stair training, Taping, Joint mobilization,  DME instructions, Cryotherapy, and Moist heat.  PLAN FOR NEXT SESSION: Aquatic: general core/hip/glute strengthening and stretching; instruction on positioning for pain management: HEP max 4 visits Land: re-assess left SIJ vs hamstring/glute tendinosis L>R.  Glute and core strengthening, posture re-alignment; HEP   Rojean Batten PT, DPT 07/30/24  12:25 PM   [facility] [address] [place], [place], [address] Phone: [phone]   Fax:  [phone]

## 2024-08-06 ENCOUNTER — Encounter (HOSPITAL_BASED_OUTPATIENT_CLINIC_OR_DEPARTMENT_OTHER): Admitting: Physical Therapy

## 2024-08-13 ENCOUNTER — Ambulatory Visit (HOSPITAL_BASED_OUTPATIENT_CLINIC_OR_DEPARTMENT_OTHER): Attending: Family Medicine | Admitting: Physical Therapy

## 2024-08-13 ENCOUNTER — Encounter (HOSPITAL_BASED_OUTPATIENT_CLINIC_OR_DEPARTMENT_OTHER): Admitting: Physical Therapy

## 2024-08-13 DIAGNOSIS — M25552 Pain in left hip: Secondary | ICD-10-CM | POA: Insufficient documentation

## 2024-08-13 DIAGNOSIS — M25551 Pain in right hip: Secondary | ICD-10-CM | POA: Insufficient documentation

## 2024-08-13 DIAGNOSIS — M5459 Other low back pain: Secondary | ICD-10-CM | POA: Insufficient documentation

## 2024-08-13 DIAGNOSIS — M6281 Muscle weakness (generalized): Secondary | ICD-10-CM | POA: Insufficient documentation

## 2024-08-13 NOTE — Therapy (Signed)
 OUTPATIENT PHYSICAL THERAPY THORACOLUMBAR Treatment- Re-Cert    Patient Name: Lindsey Fields MRN: [id number] DOB:13-Jul-1971, 53 y.o., female Today's Date: 08/16/2024  END OF SESSION:  PT End of Session - 08/16/24 0825     Visit Number 6    Date for Recertification  10/13/24    Authorization Type BCBS    PT Start Time 1145    PT Stop Time 1228    PT Time Calculation (min) 43 min    Activity Tolerance Patient tolerated treatment well    Behavior During Therapy [facility] for tasks assessed/performed              Past Medical History:  Diagnosis Date   Allergy    Arthritis    GERD (gastroesophageal reflux disease)    Past Surgical History:  Procedure Laterality Date   BREAST BIOPSY Left    BREAST EXCISIONAL BIOPSY Left    COLONOSCOPY  10/20/2011   Dr. Vaughn Hertz.   ESOPHAGOGASTRODUODENOSCOPY  01/31/2016   Dr. Vaughn Hertz, [facility]: Normal esophagus, mild gastritis (path with mild reactive gastropathy, normal duodenum (path normal)   Patient Active Problem List   Diagnosis Date Noted   Sacroiliac joint dysfunction of left side 09/21/2021   Hypermobility syndrome 09/21/2021    PCP: Leita MICAEL Elbe FNP  REFERRING PROVIDER: M53.3 (ICD-10-CM) - Sacroiliac joint dysfunction of left side   REFERRING DIAG: Claudene Arthea HERO, DO   Rationale for Evaluation and Treatment: Rehabilitation  THERAPY DIAG:  Other low back pain - Plan: PT plan of care cert/re-cert  Muscle weakness (generalized) - Plan: PT plan of care cert/re-cert  Bilateral hip pain - Plan: PT plan of care cert/re-cert  ONSET DATE: increasing frequency of flares in last few years  SUBJECTIVE:                                                                                                                                                                                           SUBJECTIVE STATEMENT: I had a flare up when playing with my dog the other day. I was on my knees and that bothered me.  Otherwise I have no other complaints. I haven't been taking Aleeve or anything recently.   EVAL: Pain in lb SIJ comes and goes for many years has gotten worse.  Started 20 yrs ago after I had my datr.  Walking >30 minutes, pulling my knees to my chest, kneeling/squatting, picking up anything heavy (lifting 40lb dog, case of waters).  Can increase pain next day.  If flare It can be for an entire week and am limited with all activities. Will use ice packs, have taken  muscle relaxers in past.  I feel that my whole left side can get knotted up.  Last flare 1 month ago lasting just a few days. I stay really mindful watching my movements to avoid.  PERTINENT HISTORY:  SIJ pain  PAIN:  Are you having pain? No  no pain right now.  Just feel tight in left hip leg up into shoulder  PRECAUTIONS: None  RED FLAGS: None   WEIGHT BEARING RESTRICTIONS: No  FALLS:  Has patient fallen in last 6 months? 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  DIAGNOSTIC FINDINGS:  MRI 5/25 IMPRESSION: 1. No evidence of sacroiliitis or other significant arthropathic changes. 2. Mild gluteus and common hamstring tendinosis, right greater than left. 3. Mild disc desiccation at L5-S1.  X-ray 5/25 IMPRESSION: 1. Mild bilateral hip osteoarthritis. 2. Moderate pubic symphysis osteoarthritis.  PATIENT SURVEYS:  Modified Oswestry:  MODIFIED OSWESTRY DISABILITY SCALE 08/13/24  Date: 05/07/24 Score   Pain intensity 0 = I can tolerate the pain I have without having to use pain medication.    0  2. Personal care (washing, dressing, etc.) 0 =  I  can take care of myself normally without causing increased pain.    0  3. Lifting 3 = Pain prevents me from lifting heavy weights, but I can manage (5) I have hardly any social life because of my pain. light to medium weights if they are conveniently positioned    2  4. Walking 2 =  Pain prevents me from walking more than  mile.    2  5. Sitting 0 =  I can sit in any chair as long as I like.    0  6. Standing 1 =  I can stand as long as I want but, it increases my pain.    1   7. Sleeping 0 = Pain does not prevent me from sleeping well.    0  8. Social Life 0 = My social life is normal and does not increase my pain.    0  9. Traveling 0 =  I can travel anywhere without increased pain.    0  10. Employment/ Homemaking 1 = My normal homemaking/job activities increase my pain, but I can still perform all that is required of me    0  Total 7/50 5/50   Interpretation of scores: Score Category Description  0-20% Minimal Disability The patient can cope with most living activities. Usually no treatment is indicated apart from advice on lifting, sitting and exercise  21-40% Moderate Disability The patient experiences more pain and difficulty with sitting, lifting and standing. Travel and social life are more difficult and they may be disabled from work. Personal care, sexual activity and sleeping are not grossly affected, and the patient can usually be managed by conservative means  41-60% Severe Disability Pain remains the main problem in this group, but activities of daily living are affected. These patients require a detailed investigation  61-80% Crippled Back pain impinges on all aspects of the patient's life. Positive intervention is required  81-100% Bed-bound  These patients are either bed-bound or exaggerating  their symptoms  Bluford FORBES Zoe DELENA Karon DELENA, et al. Surgery versus conservative management of stable thoracolumbar fracture: the PRESTO feasibility RCT. [place] ([place]): [employer]; 2021 Nov. [facility] Technology Assessment, No. 25.62.) Appendix 3, Oswestry Disability Index category descriptors. Available from: FindJewel[employer].cz  Minimally Clinically Important Difference (MCID) = 12.8%  COGNITION: Overall cognitive status: Within functional limits for tasks assessed     SENSATION: WFL  MUSCLE LENGTH: Hamstrings: wfl   POSTURE: forward head  8/12: standing: Lt shoulder & scapular elevation. Increased height of Lt ribs under Lt scapula, Rt ASIS elevation  Supine: Lt leg shorter, level ASIS  PALPATION: TTP right and left trochanters bilateral glutes L>R; left SI area  LUMBAR ROM:   wfl  LOWER EXTREMITY ROM:     wfl  LOWER EXTREMITY MMT:    MMT Right eval Left eval 08/13/24  Hip flexion 33.8 33.0   Hip extension     Hip abduction 24.9 30.0 28  Hip adduction     Hip internal rotation     Hip external rotation     Knee flexion     Knee extension 35.1 35.0   Ankle dorsiflexion     Ankle plantarflexion     Ankle inversion     Ankle eversion      (Blank rows = not tested)  LUMBAR SPECIAL TESTS:  Straight leg raise test: neg; Single leg stance test: Negative, SI Compression/distraction test: Negative, FABER test: Positive, and Thomas test: Positive left  FUNCTIONAL TESTS:     4 stage balance:passed 1,2&3. SLS left x 4s; right x20 GAIT: Distance walked:  Assistive device utilized: None Level of assistance: Complete Independence Comments: slight right trendelenburg   TREATMENT Treatment                           08/13/24 - Blank lines following charge title = not provided on this treatment date.   Manual:  TPDN No There-ex: Nustep lvl 5, 5 min Quad rocking with focus on controlled movement and tight core.  Leg press 75lbs 2x10 Standing march with 3 second hold at top.  There-Act: Squats  Step ups onto 6 step fwd, lateral x10 each leg Self Care:  Nuro-Re-ed: Hooklying 90/90 with core control with  marches GTB to fatigue x2  SL March with alt leg pushing ball into wall  Clams with GTB for increased glute activation SL stance progression from floor to airex pad Gait Training: Blank lines following charge title = not provided on this treatment date.    9/24: Blank lines following charge title = not provided on this treatment date.   Manual:  TPDN No  There-ex: Nustep lvl 5, 5 min IR fall in's in supine Hip hike- standing on Rt Standing hip abduction/ Extension Standing march with 3 second hold at top.  Side stepping at counter  There-Act:  Self Care:  Nuro-Re-ed: Hooklying ball squeeze + ab set + bridge Bridge with GTB for increased glute activation  Ball squeezes supine Clams with GTB for increased glute activation PNF D2 supine with LE in 90/90    Gait Training: Blank lines following charge title = not provided on this treatment date.   Manual:  TPDN No  There-ex: IR fall in's in supine Clams  Hip hike- standing on Rt Standing squat pull off bar Leg press with 50lbs on shuttle.  There-Act:  Self Care:  Nuro-Re-ed: Hooklying ball squeeze + ab set + bridge Bridge +  UE horiz abd Ball squeezes supine Gait Training: Treatment                            8/12: Blank lines following charge title = not provided on this treatment date.   Manual:  TPDN No  There-ex: Hip hike- standing on Rt Standing squat pull off bar Standing bent over hip abd from turnout There-Act:  Self Care: Added 3-layer heel lift to LEFT shoe Nuro-Re-ed: Hooklying ball squeeze + ab set + bridge Bridge + UE horiz abd Gait Training:      PATIENT EDUCATION:  Education details: Discussed eval findings, rehab rationale, aquatic program progression/POC and pools in area. Patient is in agreement  Person educated: Patient Education method: Explanation Education comprehension: verbalized understanding  HOME EXERCISE PROGRAM: Access Code: [email or web address] URL:  [email or web address] Date: 06/17/2024 Prepared by: Harlene Cordon  Exercises - Supine Hip Adduction Isometric with Ball  - 1 x daily - 7 x weekly - 1 sets - 10 reps - 3s hold - Supine Bridge with Mini Swiss Ball Between Knees  - 1 x daily - 7 x weekly - 3 sets - 10 reps - Supine Shoulder Horizontal Abduction with Resistance  - 1 x daily - 7 x weekly - 3 sets - 10 reps - Hip Hiking on Step  - 1 x daily - 7 x weekly - 3 sets - 10 reps - Squat with Counter Support  - 1 x daily - 7 x weekly - 3 sets - 10 reps - Sit to Stand with Mercer Between Knees  - 1 x daily - 7 x weekly - 3 sets - 10 reps 3-layer heel lift in LEFT shoe (wear for 48 hrs after 8/12 appt)  ASSESSMENT:  CLINICAL IMPRESSION: Pt has made great progress since her start of PT. She is now able to tolerate increased standing exercises with progression of SL stance and glute med strengthening. She reports pain when playing with her dog and sitting on her heels for extended periods of time. She continues to wear a heel insert and reports increased pain when she is not wearing it. Now able to walk further distances and perform increased activity with minimal to no pain. Pt will continue to benefit from skilled PT to address continued deficits for 1-2 more sessions prior to D/C to a comprehensive HEP.    EVAL: Patient is a 53 y.o. f who was seen today for physical therapy evaluation and treatment for SIJ dysfunction left side.  Pt presents without pain today/not flared. Reports intermittent flares which involve left SIJ area pain that can last several days to 1-2 weeks.  Flares occur when pt walks up to 30 minutes, lifts heavy objects (40 lb dog or case of waters) or if she brings her knees to her chest while squatting or pulling knees to chest in supine.  She reports she is constantly mindful of her movements to avoid flare.  Has been going on for several years with increasing frequency.  Diagnositics do not support SIJ  dysfunction.  Md indicates maybe hypermobility in L SIJ.  I am suspicious of gluteus and common hamstring tendinosis findings in MRI (taken when she was  not in a flare) as to being a contributing factor. She does appear to have a slight R trendelenburg sign.  Plan to see her in aquatics to instruct HEP for strengthening of area and as a pain management tool when flared.  Will also be  seen by land therapist for further assessment and treatment.  OBJECTIVE IMPAIRMENTS: Abnormal gait, difficulty walking, decreased strength, and intermittent pain.   ACTIVITY LIMITATIONS: lifting, bending, stairs, and locomotion level  PARTICIPATION LIMITATIONS: walking and daily exercise for overall wellbeing and health   REHAB POTENTIAL: Good  CLINICAL DECISION MAKING: Stable/uncomplicated  EVALUATION COMPLEXITY: Moderate   GOALS: Goals reviewed with patient? Yes  SHORT TERM GOALS: Target date: 06/04/24  Pt to instructed and indep in aquatic exercise program for strengthening and pain management using the properties of water Baseline: Goal status: MET 08/13/24    LONG TERM GOALS: Target date: 10/13/24  Pt to improve on ODI by 10% to demonstrate statistically significant Improvement in function. Baseline: 7/50=14% Goal status: MET 08/13/24  2.  Pt will report amb toleration without limitation to pain x 1 hour Baseline: 30 min Goal status: MET 08/13/24  3.  Pt will be without pain flare throughout episode Baseline:  Goal status: Ongoing 08/13/24  4.  Gait without right trendelenburg Baseline:  Goal status: Ongoing 08/13/24  5.  Pt will be indep with final HEP's (land and aquatic as appropriate) for continued management of condition Baseline:  Goal status: Ongoing 08/13/24    PLAN:  PT FREQUENCY: 1-2x/week  PT DURATION:3 weeks.   PLANNED INTERVENTIONS: 97164- PT Re-evaluation, 97750- Physical Performance Testing, 97110-Therapeutic exercises, 97530- Therapeutic activity, [id number]-  Neuromuscular re-education, [phone]- Self Care, 02859- Manual therapy, [phone]- Gait training, [phone]- Orthotic Initial, [phone]- Aquatic Therapy, [phone]- Electrical stimulation (unattended), [phone]- Ionotophoresis 4mg /ml Dexamethasone, 20560 (1-2 muscles), 20561 (3+ muscles)- Dry Needling, Patient/Family education, Balance training, Stair training, Taping, Joint mobilization, DME instructions, Cryotherapy, and Moist heat.  PLAN FOR NEXT SESSION: Aquatic: general core/hip/glute strengthening and stretching; instruction on positioning for pain management: HEP max 4 visits Land: re-assess left SIJ vs hamstring/glute tendinosis L>R.  Glute and core strengthening, posture re-alignment; HEP   Rojean Batten PT, DPT 08/16/24  8:25 AM   [facility] [address] [place], [place], [address] Phone: [phone]   Fax:  [phone]

## 2024-08-20 ENCOUNTER — Encounter (HOSPITAL_BASED_OUTPATIENT_CLINIC_OR_DEPARTMENT_OTHER): Admitting: Physical Therapy

## 2024-08-23 ENCOUNTER — Ambulatory Visit (HOSPITAL_BASED_OUTPATIENT_CLINIC_OR_DEPARTMENT_OTHER): Admitting: Physical Therapy

## 2024-08-23 ENCOUNTER — Encounter (HOSPITAL_BASED_OUTPATIENT_CLINIC_OR_DEPARTMENT_OTHER): Payer: Self-pay | Admitting: Physical Therapy

## 2024-08-23 DIAGNOSIS — M5459 Other low back pain: Secondary | ICD-10-CM

## 2024-08-23 DIAGNOSIS — M25552 Pain in left hip: Secondary | ICD-10-CM | POA: Diagnosis not present

## 2024-08-23 DIAGNOSIS — M25551 Pain in right hip: Secondary | ICD-10-CM | POA: Diagnosis not present

## 2024-08-23 DIAGNOSIS — M6281 Muscle weakness (generalized): Secondary | ICD-10-CM | POA: Diagnosis not present

## 2024-08-23 NOTE — Therapy (Signed)
 OUTPATIENT PHYSICAL THERAPY THORACOLUMBAR Treatment   Patient Name: Lindsey Fields MRN: [id number] DOB:Sep 17, 1971, 53 y.o., female Today's Date: 08/23/2024  END OF SESSION:  PT End of Session - 08/23/24 0749     Visit Number 7    Date for Recertification  10/13/24    Authorization Type BCBS    PT Start Time 0745    PT Stop Time 0828    PT Time Calculation (min) 43 min    Activity Tolerance Patient tolerated treatment well    Behavior During Therapy [facility] for tasks assessed/performed               Past Medical History:  Diagnosis Date   Allergy    Arthritis    GERD (gastroesophageal reflux disease)    Past Surgical History:  Procedure Laterality Date   BREAST BIOPSY Left    BREAST EXCISIONAL BIOPSY Left    COLONOSCOPY  10/20/2011   Dr. Vaughn Hertz.   ESOPHAGOGASTRODUODENOSCOPY  01/31/2016   Dr. Vaughn Hertz, [facility]: Normal esophagus, mild gastritis (path with mild reactive gastropathy, normal duodenum (path normal)   Patient Active Problem List   Diagnosis Date Noted   Sacroiliac joint dysfunction of left side 09/21/2021   Hypermobility syndrome 09/21/2021    PCP: Leita MICAEL Elbe FNP  REFERRING PROVIDER: M53.3 (ICD-10-CM) - Sacroiliac joint dysfunction of left side   REFERRING DIAG: Claudene Arthea HERO, DO   Rationale for Evaluation and Treatment: Rehabilitation  THERAPY DIAG:  Other low back pain  Muscle weakness (generalized)  Bilateral hip pain  ONSET DATE: increasing frequency of flares in last few years  SUBJECTIVE:                                                                                                                                                                                           SUBJECTIVE STATEMENT: Improved ability to do deep squat. Had to sit on a bleacher yesterday so it was a little sore.   EVAL: Pain in lb SIJ comes and goes for many years has gotten worse.  Started 20 yrs ago after I had my datr.   Walking >30 minutes, pulling my knees to my chest, kneeling/squatting, picking up anything heavy (lifting 40lb dog, case of waters).  Can increase pain next day.  If flare It can be for an entire week and am limited with all activities. Will use ice packs, have taken muscle relaxers in past.  I feel that my whole left side can get knotted up.  Last flare 1 month ago lasting just a few days. I stay really mindful watching my movements to avoid.  PERTINENT HISTORY:  SIJ pain  PAIN:  Are you having pain? No  no pain right now.  Just feel tight in left hip leg up into shoulder  PRECAUTIONS: None  RED FLAGS: None   WEIGHT BEARING RESTRICTIONS: No  FALLS:  Has patient fallen in last 6 months? 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  DIAGNOSTIC FINDINGS:  MRI 5/25 IMPRESSION: 1. No evidence of sacroiliitis or other significant arthropathic changes. 2. Mild gluteus and common hamstring tendinosis, right greater than left. 3. Mild disc desiccation at L5-S1.  X-ray 5/25 IMPRESSION: 1. Mild bilateral hip osteoarthritis. 2. Moderate pubic symphysis osteoarthritis.  PATIENT SURVEYS:  Modified Oswestry:  MODIFIED OSWESTRY DISABILITY SCALE 08/13/24  Date: 05/07/24 Score   Pain intensity 0 = I can tolerate the pain I have without having to use pain medication.    0  2. Personal care (washing, dressing, etc.) 0 =  I can take care of myself normally without causing increased pain.    0  3. Lifting 3 = Pain prevents me from lifting heavy weights, but I can manage (5) I have hardly any social life because of my pain.  light to medium weights if they are conveniently positioned    2  4. Walking 2 =  Pain prevents me from walking more than  mile.    2  5. Sitting 0 =  I can sit in any chair as long as I like.    0  6. Standing 1 =  I can stand as long as I want but, it increases my pain.    1   7. Sleeping 0 = Pain does not prevent me from sleeping well.    0  8. Social Life 0 = My social life is normal and does not increase my pain.    0  9. Traveling 0 =  I can travel anywhere without increased pain.    0  10. Employment/ Homemaking 1 = My normal homemaking/job activities increase my pain, but I can still perform all that is required of me    0  Total 7/50 5/50   Interpretation of scores: Score Category Description  0-20% Minimal Disability The patient can cope with most living activities. Usually no treatment is indicated apart from advice on lifting, sitting and exercise  21-40% Moderate Disability The patient experiences more pain and difficulty with sitting, lifting and standing. Travel and social life are more difficult and they may be disabled from work. Personal care, sexual activity and sleeping are not grossly affected, and the patient can usually be managed by conservative means  41-60% Severe Disability Pain remains the main problem in this group, but activities of daily living are affected. These patients require a detailed investigation  61-80% Crippled Back pain impinges on all aspects of the patient's life. Positive intervention is required  81-100% Bed-bound  These patients are either bed-bound or exaggerating their symptoms  Bluford FORBES Zoe DELENA Karon DELENA, et al. Surgery versus conservative management of stable thoracolumbar fracture: the PRESTO feasibility RCT. [place] ([place]): [employer]; 2021 Nov. [facility] Technology Assessment, No. 25.62.) Appendix 3, Oswestry Disability Index  category descriptors. Available from: FindJewelers.cz  Minimally  Clinically Important Difference (MCID) = 12.8%  COGNITION: Overall cognitive status: Within functional limits for tasks assessed     SENSATION: WFL  MUSCLE LENGTH: Hamstrings: wfl   POSTURE: forward head  8/12: standing: Lt shoulder & scapular elevation. Increased height of Lt ribs under Lt scapula, Rt ASIS elevation  Supine: Lt leg shorter, level ASIS  PALPATION: TTP right and left trochanters bilateral glutes L>R; left SI area  LUMBAR ROM:   wfl  LOWER EXTREMITY ROM:     wfl  LOWER EXTREMITY MMT:    MMT Right eval Left eval 08/13/24 10/18 Rt/Lt  Hip flexion 33.8 33.0  36.9/35.2  Hip extension      Hip abduction 24.9 30.0 28 39.7/46.2  Hip adduction      Hip internal rotation      Hip external rotation      Knee flexion      Knee extension 35.1 35.0  28.9/29.5  Ankle dorsiflexion      Ankle plantarflexion      Ankle inversion      Ankle eversion       (Blank rows = not tested)  LUMBAR SPECIAL TESTS:  Straight leg raise test: neg; Single leg stance test: Negative, SI Compression/distraction test: Negative, FABER test: Positive, and Thomas test: Positive left  FUNCTIONAL TESTS:     4 stage balance:passed 1,2&3. SLS left x 4s; right x20   10/18: SLS bilateral 20+ with good stability GAIT: Distance walked:  Assistive device utilized: None Level of assistance: Complete Independence Comments: slight right trendelenburg   TREATMENT   Treatment                            10/18: Blank lines following charge title = not provided on this treatment date.   Manual:  TPDN No  There-ex:  There-Act: Stairs Tests & measures with connection to ADLs Posture discussion Self Care:  Nuro-Re-ed: Dead lift squat Gait Training: Around track- improving trunk rotation with arm swing   Treatment                           08/13/24 - Blank lines following charge title = not provided on this treatment date.   Manual:  TPDN No There-ex: Nustep lvl 5, 5  min Quad rocking with focus on controlled movement and tight core.  Leg press 75lbs 2x10 Standing march with 3 second hold at top.  There-Act: Squats  Step ups onto 6 step fwd, lateral x10 each leg Self Care:  Nuro-Re-ed: Hooklying 90/90 with core control with marches GTB to fatigue x2  SL March with alt leg pushing ball into wall  Clams with GTB for increased glute activation SL stance progression from floor to airex pad Gait Training: Blank lines following charge title = not provided on this treatment date.    9/24: Blank lines following charge title = not provided on this treatment date.   Manual:  TPDN No  There-ex: Nustep lvl 5, 5 min IR fall in's in supine Hip hike- standing on Rt Standing hip abduction/ Extension Standing march with 3 second hold at top.  Side stepping at counter  There-Act:  Self Care:  Nuro-Re-ed: Hooklying ball squeeze + ab set + bridge Bridge with GTB for increased glute activation  Ball squeezes supine Clams with GTB for increased glute activation PNF D2 supine with LE in 90/90  Gait Training: Blank lines following charge title = not provided on this treatment date.   Manual:  TPDN No  There-ex: IR fall in's in supine Clams  Hip hike- standing on Rt Standing squat pull off bar Leg press with 50lbs on shuttle.  There-Act:  Self Care:  Nuro-Re-ed: Hooklying ball squeeze + ab set + bridge Bridge + UE horiz abd Ball squeezes supine Gait Training: Treatment                            8/12: Blank lines following charge title = not provided on this treatment date.   Manual:  TPDN No  There-ex: Hip hike- standing on Rt Standing squat pull off bar Standing bent over hip abd from turnout There-Act:  Self Care: Added 3-layer heel lift to LEFT shoe Nuro-Re-ed: Hooklying ball squeeze + ab set + bridge Bridge + UE horiz abd Gait Training:      PATIENT EDUCATION:  Education details: Discussed eval findings, rehab  rationale, aquatic program progression/POC and pools in area. Patient is in agreement  Person educated: Patient Education method: Explanation Education comprehension: verbalized understanding  HOME EXERCISE PROGRAM: Access Code: [email or web address] URL: [email or web address] Date: 06/17/2024 Prepared by: Harlene Cordon   3-layer heel lift in LEFT shoe (wear for 48 hrs after 8/12 appt)  ASSESSMENT:  CLINICAL IMPRESSION: Pt has met all of her goals at this time and is prepared for d/c to independent program. Will continue to utilize heel lift and gradually progress walking program while continuing SIJ stability exercises.    EVAL: Patient is a 53 y.o. f who was seen today for physical therapy evaluation and treatment for SIJ dysfunction left side.  Pt presents without pain today/not flared. Reports intermittent flares which involve left SIJ area pain that can last several days to 1-2 weeks.  Flares occur when pt walks up to 30 minutes, lifts heavy objects (40 lb dog or case of waters) or if she brings her knees to her chest while squatting or pulling knees to chest in supine.  She reports she is constantly mindful of her movements to avoid flare.  Has been going on for several years with increasing frequency.  Diagnositics do not support SIJ dysfunction.  Md indicates maybe hypermobility in L SIJ.  I am suspicious of gluteus and common hamstring tendinosis findings in MRI (taken when she was  not in a flare) as to being a contributing factor. She does appear to have a slight R trendelenburg sign.  Plan to see her in aquatics to instruct HEP for strengthening of area and as a pain management tool when flared.  Will also be seen by land therapist for further assessment and treatment.  OBJECTIVE IMPAIRMENTS: Abnormal gait, difficulty walking, decreased strength, and intermittent pain.   ACTIVITY LIMITATIONS: lifting, bending, stairs, and locomotion level  PARTICIPATION LIMITATIONS:  walking and daily exercise for overall wellbeing and health   REHAB POTENTIAL: Good  CLINICAL DECISION MAKING: Stable/uncomplicated  EVALUATION COMPLEXITY: Moderate   GOALS: Goals reviewed with patient? Yes  SHORT TERM GOALS: Target date: 06/04/24  Pt to instructed and indep in aquatic exercise program for strengthening and pain management using the properties of water Baseline: Goal status: MET 08/13/24    LONG TERM GOALS: Target date: 10/13/24  Pt to improve on ODI by 10% to demonstrate statistically significant Improvement in function. Baseline: 7/50=14% Goal status: MET 08/13/24  2.  Pt will report amb toleration without limitation to pain  x 1 hour Baseline: 30 min Goal status: MET 08/13/24  3.  Pt will be without pain flare throughout episode Baseline:  Goal status: MET  4.  Gait without right trendelenburg Baseline:  Goal status: MET  5.  Pt will be indep with final HEP's (land and aquatic as appropriate) for continued management of condition Baseline:  Goal status: Ongoing 08/13/24    PLAN:  PT FREQUENCY: 1-2x/week  PT DURATION:3 weeks.   PLANNED INTERVENTIONS: 97164- PT Re-evaluation, 97750- Physical Performance Testing, 97110-Therapeutic exercises, 97530- Therapeutic activity, [id number]- Neuromuscular re-education, [phone]- Self Care, 02859- Manual therapy, [phone]- Gait training, [phone]- Orthotic Initial, [phone]- Aquatic Therapy, [phone]- Electrical stimulation (unattended), [phone]- Ionotophoresis 4mg /ml Dexamethasone, 20560 (1-2 muscles), 20561 (3+ muscles)- Dry Needling, Patient/Family education, Balance training, Stair training, Taping, Joint mobilization, DME instructions, Cryotherapy, and Moist heat.  PLAN FOR NEXT SESSION: Aquatic: general core/hip/glute strengthening and stretching; instruction on positioning for pain management: HEP max 4 visits Land: re-assess left SIJ vs hamstring/glute tendinosis L>R.  Glute and core strengthening, posture re-alignment;  HEP   Rania Prothero C. Stanislaw Acton PT, DPT 08/23/24 8:31 AM    [facility] [address] [place], [place], [address] Phone: [phone]   Fax:  [phone]

## 2024-08-27 ENCOUNTER — Encounter (HOSPITAL_BASED_OUTPATIENT_CLINIC_OR_DEPARTMENT_OTHER): Admitting: Physical Therapy

## 2024-09-03 ENCOUNTER — Encounter (HOSPITAL_BASED_OUTPATIENT_CLINIC_OR_DEPARTMENT_OTHER): Admitting: Physical Therapy

## 2024-09-04 ENCOUNTER — Ambulatory Visit: Admitting: Family Medicine

## 2024-09-16 DIAGNOSIS — H524 Presbyopia: Secondary | ICD-10-CM | POA: Diagnosis not present

## 2024-10-01 ENCOUNTER — Ambulatory Visit
Admission: RE | Admit: 2024-10-01 | Discharge: 2024-10-01 | Disposition: A | Discharge status: Home / Self Care | Attending: Family Medicine | Admitting: Family Medicine

## 2024-10-01 VITALS — BP 118/76 | HR 78 | Temp 98.1°F | Resp 18 | Ht 62.0 in | Wt 155.0 lb

## 2024-10-01 DIAGNOSIS — B9689 Other specified bacterial agents as the cause of diseases classified elsewhere: Secondary | ICD-10-CM | POA: Diagnosis not present

## 2024-10-01 DIAGNOSIS — J019 Acute sinusitis, unspecified: Secondary | ICD-10-CM

## 2024-10-01 MED ORDER — AMOXICILLIN 875 MG PO TABS: 875.0000 mg | ORAL_TABLET | Freq: Two times a day (BID) | ORAL | 0 refills | Status: DC

## 2024-10-01 MED ORDER — FLUTICASONE PROPIONATE 50 MCG/ACT NA SUSP: 2.0000 | Freq: Every day | NASAL | 0 refills | Status: AC

## 2024-10-01 NOTE — ED Triage Notes (Signed)
 Patient c/o cold sx's x 6 days, fever last night, sinus pressure and cough.  Patient has taken Mucinex, Vitamin D , nasal spray, Zinc and Elderberry.

## 2024-10-01 NOTE — Discharge Instructions (Signed)
 Start Flonase .  Use until your symptoms have resolved Take amoxicillin  2 times a day Make sure you are drinking lots of fluids Consider salt water or saline nasal rinses

## 2024-10-01 NOTE — ED Provider Notes (Signed)
 Lindsey Fields [facility]    CSN: [id number] Arrival date & time: 10/01/24  1552      History   Chief Complaint Chief Complaint  Patient presents with   Nasal Congestion    I have been sick for about 6-7 days.  Had an elevated temp in the middle of the night and don't seem to be getting better.Taking vitamins, mucinex and fluids - Entered by patient    HPI Lindsey Fields is a 53 y.o. female.   Patient states she has had a cold for a week.  Runny and stuffy nose, some postnasal drip, sore throat, cough.  She thought it was a typical cold that 1 week into it started having fevers last night.  She states that she is having increased cough and chest congestion.  Temperature was up over 100.  This is not usual for her viral respiratory illness.  She is not prone to sinus infections.  No underlying lung disease or asthma.  Non-smoker    Past Medical History:  Diagnosis Date   Allergy    Arthritis    GERD (gastroesophageal reflux disease)     Patient Active Problem List   Diagnosis Date Noted   Sacroiliac joint dysfunction of left side 09/21/2021   Hypermobility syndrome 09/21/2021    Past Surgical History:  Procedure Laterality Date   BREAST BIOPSY Left    BREAST EXCISIONAL BIOPSY Left    COLONOSCOPY  10/20/2011   Dr. Vaughn Hertz.   ESOPHAGOGASTRODUODENOSCOPY  01/31/2016   Dr. Vaughn Hertz, [facility]: Normal esophagus, mild gastritis (path with mild reactive gastropathy, normal duodenum (path normal)    OB History   No obstetric history on file.      Home Medications    Prior to Admission medications   Medication Sig Start Date End Date Taking? Authorizing Provider  amoxicillin  (AMOXIL ) 875 MG tablet Take 1 tablet (875 mg total) by mouth 2 (two) times daily. 10/01/24  Yes Maranda Jamee Jacob, MD  fluticasone  (FLONASE ) 50 MCG/ACT nasal spray Place 2 sprays into both nostrils daily. 10/01/24  Yes Maranda Jamee Jacob, MD  fexofenadine (ALLEGRA) 180 MG  tablet Take 180 mg by mouth daily.    [provider]  naproxen sodium (ALEVE) 220 MG tablet Take 220 mg by mouth as needed.    [provider]  Probiotic Product (PROBIOTIC-10 PO) Take by mouth daily.    [provider]  VITAMIN D , CHOLECALCIFEROL, PO Take 1 tablet by mouth daily.    [provider]    Family History Family History  Problem Relation Age of Onset   Heart disease Mother    Colon polyps Father    Pancreatitis Father    Colon cancer Neg Hx    Esophageal cancer Neg Hx    Rectal cancer Neg Hx    Stomach cancer Neg Hx    Pancreatic cancer Neg Hx     Social History Social History   Tobacco Use   Smoking status: Never   Smokeless tobacco: Never  Vaping Use   Vaping status: Never Used  Substance Use Topics   Alcohol use: Yes    Comment: weekly   Drug use: Not Currently     Allergies   Metaxalone   Review of Systems Review of Systems See HPI  Physical Exam Triage Vital Signs ED Triage Vitals  Encounter Vitals Group     BP 10/01/24 1628 118/76     Girls Systolic BP Percentile --  Girls Diastolic BP Percentile --      Boys Systolic BP Percentile --      Boys Diastolic BP Percentile --      Pulse Rate 10/01/24 1628 78     Resp 10/01/24 1628 18     Temp 10/01/24 1628 98.1 F (36.7 C)     Temp Source 10/01/24 1628 Oral     SpO2 10/01/24 1628 94 %     Weight 10/01/24 1629 155 lb (70.3 kg)     Height 10/01/24 1629 5' 2 (1.575 m)     Head Circumference --      Peak Flow --      Pain Score 10/01/24 1629 0     Pain Loc --      Pain Education --      Exclude from Growth Chart --    No data found.  Updated Vital Signs BP 118/76 (BP Location: Right Arm)   Pulse 78   Temp 98.1 F (36.7 C) (Oral)   Resp 18   Ht 5' 2 (1.575 m)   Wt 70.3 kg   LMP  (LMP Unknown)   SpO2 94%   BMI 28.35 kg/m      Physical Exam Constitutional:      General: She is not in acute distress.    Appearance: She is  well-developed.  HENT:     Head: Normocephalic and atraumatic.     Right Ear: Tympanic membrane normal.     Nose: Congestion present.     Comments: Facial sinuses very tender    Mouth/Throat:     Pharynx: Posterior oropharyngeal erythema present.  Eyes:     Conjunctiva/sclera: Conjunctivae normal.     Pupils: Pupils are equal, round, and reactive to light.  Cardiovascular:     Rate and Rhythm: Normal rate and regular rhythm.     Heart sounds: Normal heart sounds.  Pulmonary:     Effort: Pulmonary effort is normal. No respiratory distress.     Breath sounds: Normal breath sounds.  Abdominal:     General: There is no distension.     Palpations: Abdomen is soft.  Musculoskeletal:        General: Normal range of motion.     Cervical back: Normal range of motion.  Lymphadenopathy:     Cervical: Cervical adenopathy present.  Skin:    General: Skin is warm and dry.  Neurological:     Mental Status: She is alert.      UC Treatments / Results  Labs (all labs ordered are listed, but only abnormal results are displayed) Labs Reviewed - No data to display  EKG   Radiology No results found.  Procedures Procedures (including critical care time)  Medications Ordered in UC Medications - No data to display  Initial Impression / Assessment and Plan / UC Course  I have reviewed the triage vital signs and the nursing notes.  Pertinent labs & imaging results that were available during my care of the patient were reviewed by me and considered in my medical decision making (see chart for details).     Concern for fever developing a week after illness started.  Concern for worsening sinus pressure and symptoms.  Given antibiotic and Flonase , increase fluids, call or return for problems Final Clinical Impressions(s) / UC Diagnoses   Final diagnoses:  Acute bacterial sinusitis     Discharge Instructions      Start Flonase .  Use until your symptoms have resolved Take  amoxicillin  2  times a day Make sure you are drinking lots of fluids Consider salt water or saline nasal rinses   ED Prescriptions     Medication Sig Dispense Auth. Provider   fluticasone  (FLONASE ) 50 MCG/ACT nasal spray Place 2 sprays into both nostrils daily. 16 g Maranda Jamee Jacob, MD   amoxicillin  (AMOXIL ) 875 MG tablet Take 1 tablet (875 mg total) by mouth 2 (two) times daily. 14 tablet Maranda Jamee Jacob, MD      PDMP not reviewed this encounter.   Maranda Jamee Jacob, MD 10/01/24 [phone]

## 2024-10-21 ENCOUNTER — Encounter: Payer: Self-pay | Admitting: Medical

## 2024-10-21 ENCOUNTER — Ambulatory Visit: Admitting: Medical

## 2024-10-21 VITALS — BP 128/78 | HR 59 | Temp 97.6°F | Resp 16 | Ht 62.0 in | Wt 152.4 lb

## 2024-10-21 DIAGNOSIS — Z0184 Encounter for antibody response examination: Secondary | ICD-10-CM | POA: Diagnosis not present

## 2024-10-21 DIAGNOSIS — Z0000 Encounter for general adult medical examination without abnormal findings: Secondary | ICD-10-CM | POA: Diagnosis not present

## 2024-10-21 DIAGNOSIS — Z1231 Encounter for screening mammogram for malignant neoplasm of breast: Secondary | ICD-10-CM | POA: Diagnosis not present

## 2024-10-21 DIAGNOSIS — Z1159 Encounter for screening for other viral diseases: Secondary | ICD-10-CM | POA: Diagnosis not present

## 2024-10-21 DIAGNOSIS — R5383 Other fatigue: Secondary | ICD-10-CM

## 2024-10-21 LAB — LIPID PANEL
Cholesterol: 246 mg/dL — ABNORMAL HIGH (ref 28–200)
HDL: 57.2 mg/dL (ref >39.00)
LDL Cholesterol: 167 mg/dL — ABNORMAL HIGH (ref 10–99)
NonHDL: 188.87
Total CHOL/HDL Ratio: 4
Triglycerides: 108 mg/dL (ref 10.0–149.0)
VLDL: 21.6 mg/dL (ref 0.0–40.0)

## 2024-10-21 LAB — COMPREHENSIVE METABOLIC PANEL WITH GFR
ALT: 38 U/L — ABNORMAL HIGH (ref 3–35)
AST: 29 U/L (ref 5–37)
Albumin: 4.5 g/dL (ref 3.5–5.2)
Alkaline Phosphatase: 92 U/L (ref 39–117)
BUN: 14 mg/dL (ref 6–23)
CO2: 29 meq/L (ref 19–32)
Calcium: 9.4 mg/dL (ref 8.4–10.5)
Chloride: 104 meq/L (ref 96–112)
Creatinine, Ser: 0.65 mg/dL (ref 0.40–1.20)
GFR: 100.21 mL/min (ref >60.00)
Glucose, Bld: 83 mg/dL (ref 70–99)
Potassium: 4.5 meq/L (ref 3.5–5.1)
Sodium: 141 meq/L (ref 135–145)
Total Bilirubin: 0.5 mg/dL (ref 0.2–1.2)
Total Protein: 7 g/dL (ref 6.0–8.3)

## 2024-10-21 LAB — CBC WITH DIFFERENTIAL/PLATELET
Basophils Absolute: 0 K/uL (ref 0.0–0.1)
Basophils Relative: 0.9 % (ref 0.0–3.0)
Eosinophils Absolute: 0.1 K/uL (ref 0.0–0.7)
Eosinophils Relative: 1.1 % (ref 0.0–5.0)
HCT: 40.9 % (ref 36.0–46.0)
Hemoglobin: 13.8 g/dL (ref 12.0–15.0)
Lymphocytes Relative: 35 % (ref 12.0–46.0)
Lymphs Abs: 1.8 K/uL (ref 0.7–4.0)
MCHC: 33.6 g/dL (ref 30.0–36.0)
MCV: 92.6 fl (ref 78.0–100.0)
Monocytes Absolute: 0.3 K/uL (ref 0.1–1.0)
Monocytes Relative: 6.8 % (ref 3.0–12.0)
Neutro Abs: 2.8 K/uL (ref 1.4–7.7)
Neutrophils Relative %: 56.2 % (ref 43.0–77.0)
Platelets: 281 K/uL (ref 150.0–400.0)
RBC: 4.42 Mil/uL (ref 3.87–5.11)
RDW: 12.7 % (ref 11.5–15.5)
WBC: 5.1 K/uL (ref 4.0–10.5)

## 2024-10-21 LAB — VITAMIN B12: Vitamin B-12: 457 pg/mL (ref 211–911)

## 2024-10-21 LAB — TSH: TSH: 1.79 u[IU]/mL (ref 0.35–5.50)

## 2024-10-21 LAB — T4, FREE: Free T4: 0.76 ng/dL (ref 0.60–1.60)

## 2024-10-21 NOTE — Patient Instructions (Signed)
 Adult Wellness Visit Routine wellness visit. High stress from caregiving. No depression or anxiety. Blood pressure controlled. Recent varicella infection. No current medications. - Ordered mammogram at [facility] due to previous biopsy and surgery. - Ordered CBC, metabolic panel, lipid panel, B12, B1, and thyroid studies. - Ordered hepatitis C antibody screening. - Ordered hepatitis B surface antibody to check immune status. - Discussed pneumonia vaccine, recommended at age 47. Deferred to later date. - Discussed tdap vaccine, recommended every 10 years. Later date after review of records - Discussed HIV screening, declined by patient.  Fatigue Fatigue likely related to stress and caregiving. No depression or anxiety. Previous joint pain improved with physical therapy. - Ordered B12, B1, and thyroid studies to evaluate potential causes of fatigue.  Follow date to be determined after lab review  Preventive Care 30-53 Years Old, Female Preventive care refers to lifestyle choices and visits with your health care provider that can promote health and wellness. Preventive care visits are also called wellness exams. What can I expect for my preventive care visit? Counseling Your health care provider may ask you questions about your: Medical history, including: Past medical problems. Family medical history. Pregnancy history. Current health, including: Menstrual cycle. Method of birth control. Emotional well-being. Home life and relationship well-being. Sexual activity and sexual health. Lifestyle, including: Alcohol, nicotine or tobacco, and drug use. Access to firearms. Diet, exercise, and sleep habits. Work and work astronomer. Sunscreen use. Safety issues such as seatbelt and bike helmet use. Physical exam Your health care provider will check your: Height and weight. These may be used to calculate your BMI (body mass index). BMI is a measurement that tells if you are at a  healthy weight. Waist circumference. This measures the distance around your waistline. This measurement also tells if you are at a healthy weight and may help predict your risk of certain diseases, such as type 2 diabetes and high blood pressure. Heart rate and blood pressure. Body temperature. Skin for abnormal spots. What immunizations do I need?  Vaccines are usually given at various ages, according to a schedule. Your health care provider will recommend vaccines for you based on your age, medical history, and lifestyle or other factors, such as travel or where you work. What tests do I need? Screening Your health care provider may recommend screening tests for certain conditions. This may include: Lipid and cholesterol levels. Diabetes screening. This is done by checking your blood sugar (glucose) after you have not eaten for a while (fasting). Pelvic exam and Pap test. Hepatitis B test. Hepatitis C test. HIV (human immunodeficiency virus) test. STI (sexually transmitted infection) testing, if you are at risk. Lung cancer screening. Colorectal cancer screening. Mammogram. Talk with your health care provider about when you should start having regular mammograms. This may depend on whether you have a family history of breast cancer. BRCA-related cancer screening. This may be done if you have a family history of breast, ovarian, tubal, or peritoneal cancers. Bone density scan. This is done to screen for osteoporosis. Talk with your health care provider about your test results, treatment options, and if necessary, the need for more tests. Follow these instructions at home: Eating and drinking  Eat a diet that includes fresh fruits and vegetables, whole grains, lean protein, and low-fat dairy products. Take vitamin and mineral supplements as recommended by your health care provider. Do not drink alcohol if: Your health care provider tells you not to drink. You are pregnant, may be  pregnant, or  are planning to become pregnant. If you drink alcohol: Limit how much you have to 0-1 drink a day. Know how much alcohol is in your drink. In the [place] one drink equals one 12 oz bottle of beer (355 mL), one 5 oz glass of wine (148 mL), or one 1 oz glass of hard liquor (44 mL). Lifestyle Brush your teeth every morning and night with fluoride toothpaste. Floss one time each day. Exercise for at least 30 minutes 5 or more days each week. Do not use any products that contain nicotine or tobacco. These products include cigarettes, chewing tobacco, and vaping devices, such as e-cigarettes. If you need help quitting, ask your health care provider. Do not use drugs. If you are sexually active, practice safe sex. Use a condom or other form of protection to prevent STIs. If you do not wish to become pregnant, use a form of birth control. If you plan to become pregnant, see your health care provider for a prepregnancy visit. Take aspirin only as told by your health care provider. Make sure that you understand how much to take and what form to take. Work with your health care provider to find out whether it is safe and beneficial for you to take aspirin daily. Find healthy ways to manage stress, such as: Meditation, yoga, or listening to music. Journaling. Talking to a trusted person. Spending time with friends and family. Minimize exposure to UV radiation to reduce your risk of skin cancer. Safety Always wear your seat belt while driving or riding in a vehicle. Do not drive: If you have been drinking alcohol. Do not ride with someone who has been drinking. When you are tired or distracted. While texting. If you have been using any mind-altering substances or drugs. Wear a helmet and other protective equipment during sports activities. If you have firearms in your house, make sure you follow all gun safety procedures. Seek help if you have been physically or sexually abused. What's  next? Visit your health care provider once a year for an annual wellness visit. Ask your health care provider how often you should have your eyes and teeth checked. Stay up to date on all vaccines. This information is not intended to replace advice given to you by your health care provider. Make sure you discuss any questions you have with your health care provider. Document Revised: 04/20/2021 Document Reviewed: 04/20/2021 Elsevier Patient Education  2024 [employer].

## 2024-10-21 NOTE — Progress Notes (Signed)
 Subjective:    Patient ID: Lindsey Fields, female    DOB: [birth year], 53 y.o.   MRN: [id number]  HPI   Pt in for first time with me. Decided to do wellness exam. Pt is fasting.   Lindsey Fields is a 53 year old female who presents for a wellness exam and to address ongoing stress and fatigue.  She has significant stress related to her husband's unemployment, her in-laws moving nearby, and her role as primary caregiver, but she has no symptoms of depression or anxiety and a recent GAD-7 were low/zero per pt.   She works full-time at a animator at [employer], does not exercise regularly, and describes her diet as semi healthy.   She had a colonoscopy three years ago that showed a 12 mm polyp and is due for repeat colonoscopy next year. She has since tried to improve her diet. She drinks about three sodas per week, caffeinated tea, and alcohol (one to two beers or glasses of wine a couple of times a month. She does not smoke.  She had a prior breast biopsy and surgery with benign results. Her last mammogram was in September 2024 at [facility] and has been normal.  She is up to date on Pap smear, last in 2024 and negative. She is postmenopausal.         Review of Systems  Constitutional:  Positive for fatigue.  Respiratory:  Negative for cough, chest tightness and wheezing.   Cardiovascular:  Negative for chest pain and palpitations.  Gastrointestinal:  Negative for abdominal pain.  Genitourinary:  Negative for dysuria.  Musculoskeletal:  Negative for back pain, joint swelling and myalgias.  Skin:  Negative for rash.  Neurological:  Negative for dizziness and numbness.  Hematological:  Negative for adenopathy.  Psychiatric/Behavioral:  Negative for behavioral problems, decreased concentration and dysphoric mood.        Stress     Past Medical History:  Diagnosis Date   Allergy    Arthritis    GERD (gastroesophageal reflux disease)      Social History    Socioeconomic History   Marital status: Married    Spouse name: [name]   Number of children: 2   Years of education: Not on file   Highest education level: Master's degree (e.g., MA, MS, MEng, MEd, MSW, MBA)  Occupational History   Not on file  Tobacco Use   Smoking status: Never   Smokeless tobacco: Never  Vaping Use   Vaping status: Never Used  Substance and Sexual Activity   Alcohol use: Yes    Comment: weekly   Drug use: Not Currently   Sexual activity: Yes    Birth control/protection: None  Other Topics Concern   Not on file  Social History Narrative   Not on file   Social Drivers of Health   Tobacco Use: Low Risk (10/21/2024)   Patient History    Smoking Tobacco Use: Never    Smokeless Tobacco Use: Never    Passive Exposure: Not on file  Financial Resource Strain: Medium Risk (10/20/2024)   Overall Financial Resource Strain (CARDIA)    Difficulty of Paying Living Expenses: Somewhat hard  Food Insecurity: No Food Insecurity (10/20/2024)   Epic    Worried About Radiation Protection Practitioner of Food in the Last Year: Never true    Ran Out of Food in the Last Year: Never true  Transportation Needs: No Transportation Needs (10/20/2024)   Epic    Lack of Transportation (  Medical): No    Lack of Transportation (Non-Medical): No  Physical Activity: Inactive (10/20/2024)   Exercise Vital Sign    Days of Exercise per Week: 0 days    Minutes of Exercise per Session: Not on file  Stress: No Stress Concern Present (10/20/2024)   [employer] of Occupational Health - Occupational Stress Questionnaire    Feeling of Stress: Only a little  Social Connections: Socially Integrated (10/20/2024)   Social Connection and Isolation Panel    Frequency of Communication with Friends and Family: More than three times a week    Frequency of Social Gatherings with Friends and Family: More than three times a week    Attends Religious Services: More than 4 times per year    Active Member of Clubs or  Organizations: Yes    Attends Banker Meetings: More than 4 times per year    Marital Status: Married  Catering Manager Violence: Not on file  Depression (PHQ2-9): Low Risk (12/28/2023)   Depression (PHQ2-9)    PHQ-2 Score: 0  Alcohol Screen: Low Risk (10/20/2024)   Alcohol Screen    Last Alcohol Screening Score (AUDIT): 1  Housing: Low Risk (10/20/2024)   Epic    Unable to Pay for Housing in the Last Year: No    Number of Times Moved in the Last Year: 0    Homeless in the Last Year: No  Utilities: Not on file  Health Literacy: Not on file    Past Surgical History:  Procedure Laterality Date   BREAST BIOPSY Left    BREAST EXCISIONAL BIOPSY Left    COLONOSCOPY  10/20/2011   Dr. Vaughn Hertz.   ESOPHAGOGASTRODUODENOSCOPY  01/31/2016   Dr. Vaughn Hertz, [facility]: Normal esophagus, mild gastritis (path with mild reactive gastropathy, normal duodenum (path normal)    Family History  Problem Relation Age of Onset   Heart disease Mother    Colon polyps Father    Pancreatitis Father    Colon cancer Neg Hx    Esophageal cancer Neg Hx    Rectal cancer Neg Hx    Stomach cancer Neg Hx    Pancreatic cancer Neg Hx     Allergies[1]  Medications Ordered Prior to Encounter[2]  BP 128/78   Pulse (!) 59   Temp 97.6 F (36.4 C) (Oral)   Resp 16   Ht 5' 2 (1.575 m)   Wt 152 lb 6.4 oz (69.1 kg)   LMP  (LMP Unknown)   SpO2 96%   BMI 27.87 kg/m         Objective:   Physical Exam  General Mental Status- Alert. General Appearance- Not in acute distress.   Skin General: Color- Normal Color. Moisture- Normal Moisture.  Neck Carotid Arteries- Normal color. Moisture- Normal Moisture. No carotid bruits. No JVD.  Chest and Lung Exam Auscultation: Breath Sounds:-CTA  Cardiovascular Auscultation:Rythm- RRR Murmurs & Other Heart Sounds:Auscultation of the heart reveals- No Murmurs.  Abdomen Inspection:-Inspeection  Normal. Palpation/Percussion:Note:No mass. Palpation and Percussion of the abdomen reveal- Non Tender, Non Distended + BS, no rebound or guarding.   Neurologic Cranial Nerve exam:- CN III-XII intact(No nystagmus), symmetric smile. Strength:- 5/5 equal and symmetric strength both upper and lower extremities.      Assessment & Plan:   Adult Wellness Visit Routine wellness visit. High stress from caregiving. No depression or anxiety. Blood pressure controlled. Recent varicella infection. No current medications. - Ordered mammogram at [facility] due to previous biopsy and surgery. - Ordered  CBC, metabolic panel, lipid panel, B12, B1, and thyroid studies. - Ordered hepatitis C antibody screening. - Ordered hepatitis B surface antibody to check immune status. - Discussed pneumonia vaccine, recommended at age 55. Deferred to later date. - Discussed tdap vaccine, recommended every 10 years. Later date after review of records - Discussed HIV screening, declined by patient.  Fatigue Fatigue likely related to stress and caregiving. No depression or anxiety. Previous joint pain improved with physical therapy. - Ordered B12, B1, and thyroid studies to evaluate potential causes of fatigue.  Follow date to be determined after lab review  Dallas Maxwell, PA-C     [1]  Allergies Allergen Reactions   Metaxalone Other (See Comments)    Altered taste in mouth  [2]  Current Outpatient Medications on File Prior to Visit  Medication Sig Dispense Refill   fexofenadine (ALLEGRA) 180 MG tablet Take 180 mg by mouth daily.     fluticasone  (FLONASE ) 50 MCG/ACT nasal spray Place 2 sprays into both nostrils daily. 16 g 0   naproxen sodium (ALEVE) 220 MG tablet Take 220 mg by mouth as needed.     Probiotic Product (PROBIOTIC-10 PO) Take by mouth daily.     VITAMIN D , CHOLECALCIFEROL, PO Take 1 tablet by mouth daily.     No current facility-administered medications on file prior to visit.

## 2024-10-22 ENCOUNTER — Ambulatory Visit: Payer: Self-pay | Admitting: Medical

## 2024-10-22 NOTE — Progress Notes (Signed)
 Last read by Aldona Ingles at 12:55PM on 10/22/2024.

## 2024-10-24 LAB — HEPATITIS B SURFACE ANTIBODY, QUANTITATIVE: Hep B S AB Quant (Post): <5 m[IU]/mL — ABNORMAL LOW (ref >=10)

## 2024-10-24 LAB — VITAMIN B1: Vitamin B1 (Thiamine): 7 nmol/L — ABNORMAL LOW (ref 8–30)

## 2024-10-24 LAB — HEPATITIS C ANTIBODY: Hepatitis C Ab: NONREACTIVE

## 2024-10-27 NOTE — Progress Notes (Signed)
 Last read by Aldona Ingles at 9:34AM on 10/25/2024.

## 2024-11-21 ENCOUNTER — Ambulatory Visit
Admission: RE | Admit: 2024-11-21 | Discharge: 2024-11-21 | Disposition: A | Discharge status: Home / Self Care | Source: Ambulatory Visit | Attending: Medical | Admitting: Medical

## 2024-11-21 DIAGNOSIS — Z1231 Encounter for screening mammogram for malignant neoplasm of breast: Secondary | ICD-10-CM
